# Patient Record
Sex: Female | Born: 1955 | Race: White | Hispanic: No | Smoking: Never smoker
Health system: Southern US, Community
[De-identification: ages and names within clinical notes are randomized; demographics above are authoritative.]

## PROBLEM LIST (undated history)

## (undated) DIAGNOSIS — E785 Hyperlipidemia, unspecified: Secondary | ICD-10-CM

## (undated) DIAGNOSIS — I10 Essential (primary) hypertension: Secondary | ICD-10-CM

## (undated) DIAGNOSIS — E119 Type 2 diabetes mellitus without complications: Secondary | ICD-10-CM

## (undated) HISTORY — PX: BREAST BIOPSY: SHX20

---

## 2017-04-30 ENCOUNTER — Other Ambulatory Visit: Payer: Self-pay

## 2017-04-30 ENCOUNTER — Emergency Department
Admission: EM | Admit: 2017-04-30 | Discharge: 2017-04-30 | Disposition: A | Discharge status: Home / Self Care | Payer: No Typology Code available for payment source | Attending: Emergency Medicine | Admitting: Emergency Medicine

## 2017-04-30 ENCOUNTER — Emergency Department: Payer: No Typology Code available for payment source

## 2017-04-30 DIAGNOSIS — M25561 Pain in right knee: Secondary | ICD-10-CM | POA: Insufficient documentation

## 2017-04-30 MED ORDER — MELOXICAM 15 MG PO TABS: 15.0000 mg | ORAL_TABLET | Freq: Every day | ORAL | 1 refills | Status: AC
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
## 2018-07-30 ENCOUNTER — Emergency Department: Payer: Medicare PPO

## 2018-07-30 ENCOUNTER — Other Ambulatory Visit: Payer: Self-pay

## 2018-07-30 ENCOUNTER — Emergency Department
Admission: EM | Admit: 2018-07-30 | Discharge: 2018-07-30 | Disposition: A | Discharge status: Home / Self Care | Payer: Medicare PPO | Attending: Emergency Medicine | Admitting: Emergency Medicine

## 2018-07-30 ENCOUNTER — Encounter: Payer: Self-pay | Admitting: Physician Assistant

## 2018-07-30 DIAGNOSIS — Y998 Other external cause status: Secondary | ICD-10-CM | POA: Insufficient documentation

## 2018-07-30 DIAGNOSIS — S0990XA Unspecified injury of head, initial encounter: Secondary | ICD-10-CM | POA: Diagnosis present

## 2018-07-30 DIAGNOSIS — Y929 Unspecified place or not applicable: Secondary | ICD-10-CM | POA: Insufficient documentation

## 2018-07-30 DIAGNOSIS — S0003XA Contusion of scalp, initial encounter: Secondary | ICD-10-CM | POA: Diagnosis not present

## 2018-07-30 DIAGNOSIS — Y93K1 Activity, walking an animal: Secondary | ICD-10-CM | POA: Diagnosis not present

## 2018-07-30 DIAGNOSIS — W010XXA Fall on same level from slipping, tripping and stumbling without subsequent striking against object, initial encounter: Secondary | ICD-10-CM | POA: Diagnosis not present

## 2018-07-30 LAB — BASIC METABOLIC PANEL
Anion gap: 9 (ref 5–15)
BUN: 12 mg/dL (ref 8–23)
CO2: 27 mmol/L (ref 22–32)
Calcium: 8.9 mg/dL (ref 8.9–10.3)
Chloride: 103 mmol/L (ref 98–111)
Creatinine, Ser: 0.75 mg/dL (ref 0.44–1.00)
GFR calc Af Amer: >60 mL/min (ref >60)
GFR calc non Af Amer: >60 mL/min (ref >60)
Glucose, Bld: 195 mg/dL — ABNORMAL HIGH (ref 70–99)
Potassium: 3.7 mmol/L (ref 3.5–5.1)
Sodium: 139 mmol/L (ref 135–145)

## 2018-07-30 LAB — CBC WITH DIFFERENTIAL/PLATELET
Abs Immature Granulocytes: 0.04 10*3/uL (ref 0.00–0.07)
Basophils Absolute: 0.1 10*3/uL (ref 0.0–0.1)
Basophils Relative: 1 %
Eosinophils Absolute: 0.2 10*3/uL (ref 0.0–0.5)
Eosinophils Relative: 2 %
HCT: 36.6 % (ref 36.0–46.0)
Hemoglobin: 12.2 g/dL (ref 12.0–15.0)
Immature Granulocytes: 1 %
Lymphocytes Relative: 28 %
Lymphs Abs: 2.1 10*3/uL (ref 0.7–4.0)
MCH: 29.5 pg (ref 26.0–34.0)
MCHC: 33.3 g/dL (ref 30.0–36.0)
MCV: 88.4 fL (ref 80.0–100.0)
Monocytes Absolute: 0.6 10*3/uL (ref 0.1–1.0)
Monocytes Relative: 8 %
Neutro Abs: 4.6 10*3/uL (ref 1.7–7.7)
Neutrophils Relative %: 60 %
Platelets: 263 10*3/uL (ref 150–400)
RBC: 4.14 MIL/uL (ref 3.87–5.11)
RDW: 12.8 % (ref 11.5–15.5)
WBC: 7.7 10*3/uL (ref 4.0–10.5)
nRBC: 0 % (ref 0.0–0.2)

## 2018-07-30 NOTE — Discharge Instructions (Addendum)
Your exam, labs, and CT scans are normal following your fall. Take OTC ibuprofen for pain as needed. Follow-up with your provider for ongoing symptoms.

## 2018-07-30 NOTE — ED Notes (Signed)
Patient AAOX4. Vitals Stable. NAD. 

## 2018-07-30 NOTE — ED Provider Notes (Addendum)
-----------------------------------------   2:39 PM on 07/30/2018 -----------------------------------------  Patient's work-up has been largely nonrevealing.  CT scan of the head and neck are negative for acute abnormality besides a large occipital hematoma.  I personally evaluated the patient.  I discussed the incidental finding of her lung nodule and the need to follow-up with her PCP regarding further imaging for evaluation.  Patient agreeable to plan of care.  Patient had no other concerns or questions from myself.  Patient will be discharged home.  EKG viewed and interpreted by myself shows a normal sinus rhythm at 70 bpm with a narrow QRS, left axis deviation, largely normal intervals with nonspecific ST changes without ST elevation.   Minna Antis, MD 07/30/18 1439    Minna Antis, MD 07/30/18 1453

## 2018-07-30 NOTE — ED Triage Notes (Signed)
Brooke Gardner arrived via [employer]. Was walking her dog when another dog "pounced on her" EMS stated that that was when she fell backwards and hit her head. Upon assessment there is a slight hematoma on the back of her head. No Loss of Consciousness.  Vitals stable.

## 2018-07-30 NOTE — ED Provider Notes (Signed)
[facility] Emergency Department Provider Note ____________________________________________  Time seen: 1230  I have reviewed the triage vital signs and the nursing notes.  HISTORY  Chief Complaint  Fall  HPI Brooke Gardner is a 63 y.o. female with a history of diabetes and hypertension, presents to the ED via EMS, for evaluation following a mechanical fall.  Patient describes she was out walking her dog, when another dog approached him, and counseled on her.  The large dog caused the patient to fall backwards, hitting the back of her head.  She denies any loss of consciousness, nausea, vomiting, or dizziness.  She presents for evaluation of a large hematoma to the back of the scalp.  She denies any significant pain related to her fall at this time.  She is denied any chest pain, shortness of breath, back pain, or other musculoskeletal injury.  History reviewed. No pertinent past medical history.  There are no active problems to display for this patient.  History reviewed. No pertinent surgical history.  Prior to Admission medications   Not on File    Allergies Percocet [oxycodone-acetaminophen]  History reviewed. No pertinent family history.  Social History Social History   Tobacco Use  . Smoking status: Not on file  Substance Use Topics  . Alcohol use: Not on file  . Drug use: Not on file    Review of Systems  Constitutional: Negative for fever. Eyes: Negative for visual changes. ENT: Negative for sore throat. Cardiovascular: Negative for chest pain. Respiratory: Negative for shortness of breath. Gastrointestinal: Negative for abdominal pain, vomiting and diarrhea. Genitourinary: Negative for dysuria. Musculoskeletal: Negative for back pain. Skin: Negative for rash. Neurological: Negative for headaches, focal weakness or numbness. ____________________________________________  PHYSICAL EXAM:  VITAL SIGNS: ED Triage Vitals  Enc Vitals  Group     BP      Pulse      Resp      Temp      Temp src      SpO2      Weight      Height      Head Circumference      Peak Flow      Pain Score      Pain Loc      Pain Edu?      Excl. in GC?     Constitutional: Alert and oriented. Well appearing and in no distress. Head: Normocephalic and atraumatic, except for a large hematoma to the crown. No laceration or abrasion noted. No Battle's sign. Eyes: Conjunctivae are normal. PERRL. Normal extraocular movements Ears: no otorrhea Nose: No congestion/rhinorrhea/epistaxis. Mouth/Throat: Mucous membranes are moist. Neck: Supple. No midline tenderness. Cardiovascular: Normal rate, regular rhythm. Normal distal pulses. Respiratory: Normal respiratory effort. No wheezes/rales/rhonchi. Gastrointestinal: Soft and nontender. No distention. Musculoskeletal: Nontender with normal range of motion in all extremities.  Neurologic:  CN II-XII grossly intact. Normal speech and language. No gross focal neurologic deficits are appreciated. Skin:  Skin is warm, dry and intact. No rash noted. Psychiatric: Mood and affect are normal. Patient exhibits appropriate insight and judgment. ____________________________________________   LABS (pertinent positives/negatives) Labs Reviewed  BASIC METABOLIC PANEL - Abnormal; Notable for the following components:      Result Value   Glucose, Bld 195 (*)    All other components within normal limits  CBC WITH DIFFERENTIAL/PLATELET  ____________________________________________  EKG  Sinus rhythm, variable rate 50-99 bpm Normal intervals No STEMI ____________________________________________   RADIOLOGY  CT Head & Cervical Spine w/o CM  IMPRESSION: 1. Large posterior left parietal scalp hematoma. 2. No evidence of acute intracranial abnormality. No evidence of calvarial fracture. 3. No cervical spine fracture or facet subluxation. 4. Marked multilevel degenerative changes in the cervical spine  as detailed. 5. Subsolid 2.0 cm posterior apical right upper lobe pulmonary nodule. Suggest chest CT on a short-term basis for further evaluation when clinically feasible. ____________________________________________  PROCEDURES  Procedures ____________________________________________  INITIAL IMPRESSION / ASSESSMENT AND PLAN / ED COURSE  Brooke Gardner was evaluated in Emergency Department on 07/30/2018 for the symptoms described in the history of present illness. She was evaluated in the context of the global COVID-19 pandemic, which necessitated consideration that the patient might be at risk for infection with the SARS-CoV-2 virus that causes COVID-19. Institutional protocols and algorithms that pertain to the evaluation of patients at risk for COVID-19 are in a state of rapid change based on information released by regulatory bodies including the CDC and federal and state organizations. These policies and algorithms were followed during the patient's care in the ED.  Patient with a reassuring exam following her mechanical fall prior to arrival.  She has a scalp hematoma without any intracranial process or acute cervical fracture on the CT imaging.  She be discharged with instructions to follow-up on incidental lung nodule finding on her C-spine as well.  Patient is stable for discharge at this time, and will follow-up with her primary provider.  Return precautions have been reviewed. ____________________________________________  FINAL CLINICAL IMPRESSION(S) / ED DIAGNOSES  Final diagnoses:  Contusion of scalp, initial encounter  Minor head injury, initial encounter      Lissa Hoard, PA-C 07/30/18 1418    Minna Antis, MD 07/30/18 1440

## 2019-05-01 ENCOUNTER — Other Ambulatory Visit: Payer: Self-pay | Admitting: Physician Assistant

## 2019-05-01 DIAGNOSIS — Z780 Asymptomatic menopausal state: Secondary | ICD-10-CM

## 2019-05-01 DIAGNOSIS — Z1231 Encounter for screening mammogram for malignant neoplasm of breast: Secondary | ICD-10-CM

## 2019-09-20 ENCOUNTER — Ambulatory Visit
Admission: RE | Admit: 2019-09-20 | Discharge: 2019-09-20 | Disposition: A | Discharge status: Home / Self Care | Payer: Medicare PPO | Source: Ambulatory Visit | Attending: Physician Assistant | Admitting: Physician Assistant

## 2019-09-20 ENCOUNTER — Other Ambulatory Visit: Payer: Self-pay

## 2019-09-20 DIAGNOSIS — Z1231 Encounter for screening mammogram for malignant neoplasm of breast: Secondary | ICD-10-CM | POA: Insufficient documentation

## 2019-09-20 DIAGNOSIS — Z780 Asymptomatic menopausal state: Secondary | ICD-10-CM | POA: Insufficient documentation

## 2019-10-09 ENCOUNTER — Inpatient Hospital Stay
Admission: RE | Admit: 2019-10-09 | Discharge: 2019-10-09 | Disposition: A | Discharge status: Home / Self Care | Payer: Self-pay | Source: Ambulatory Visit | Attending: *Deleted | Admitting: *Deleted

## 2019-10-09 ENCOUNTER — Other Ambulatory Visit: Payer: Self-pay | Admitting: Physician Assistant

## 2019-10-09 ENCOUNTER — Other Ambulatory Visit: Payer: Self-pay | Admitting: *Deleted

## 2019-10-09 DIAGNOSIS — Z1231 Encounter for screening mammogram for malignant neoplasm of breast: Secondary | ICD-10-CM

## 2019-10-09 DIAGNOSIS — R928 Other abnormal and inconclusive findings on diagnostic imaging of breast: Secondary | ICD-10-CM

## 2019-10-09 DIAGNOSIS — N6320 Unspecified lump in the left breast, unspecified quadrant: Secondary | ICD-10-CM

## 2019-10-11 ENCOUNTER — Ambulatory Visit: Payer: Medicare PPO

## 2019-10-11 ENCOUNTER — Other Ambulatory Visit: Payer: Medicare PPO

## 2022-03-26 IMAGING — MG DIGITAL SCREENING BILAT W/ TOMO W/ CAD
8 series · 8 of 24 positions shown · non-contrast
Comparison: None.
COMPARISON: None.

Addendum:
CLINICAL DATA: Screening.

EXAM:
DIGITAL SCREENING BILATERAL MAMMOGRAM WITH TOMO AND CAD

[L CC synth-2D]
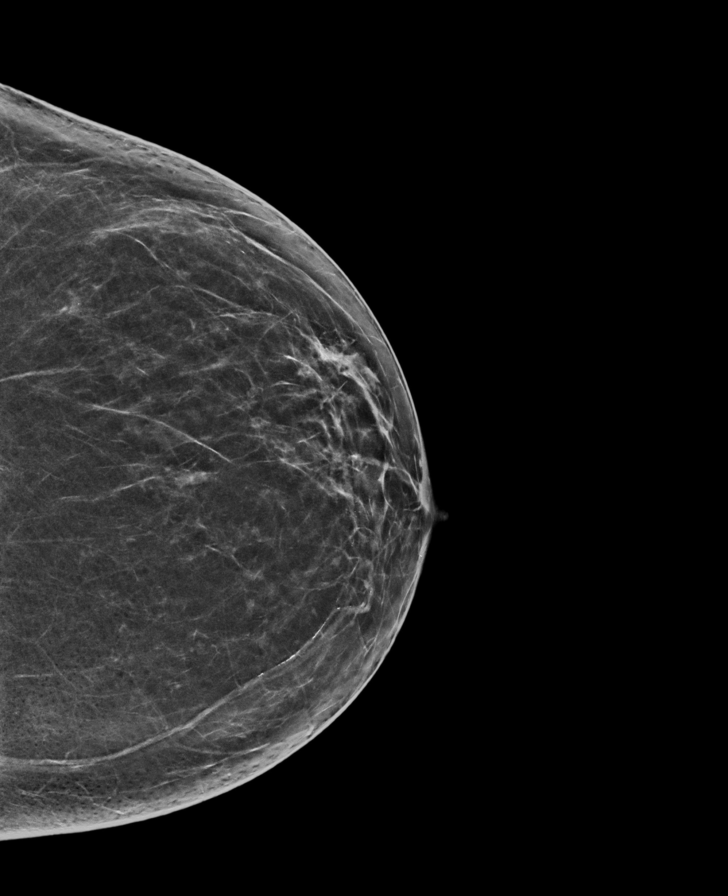

[R MLO synth-2D]
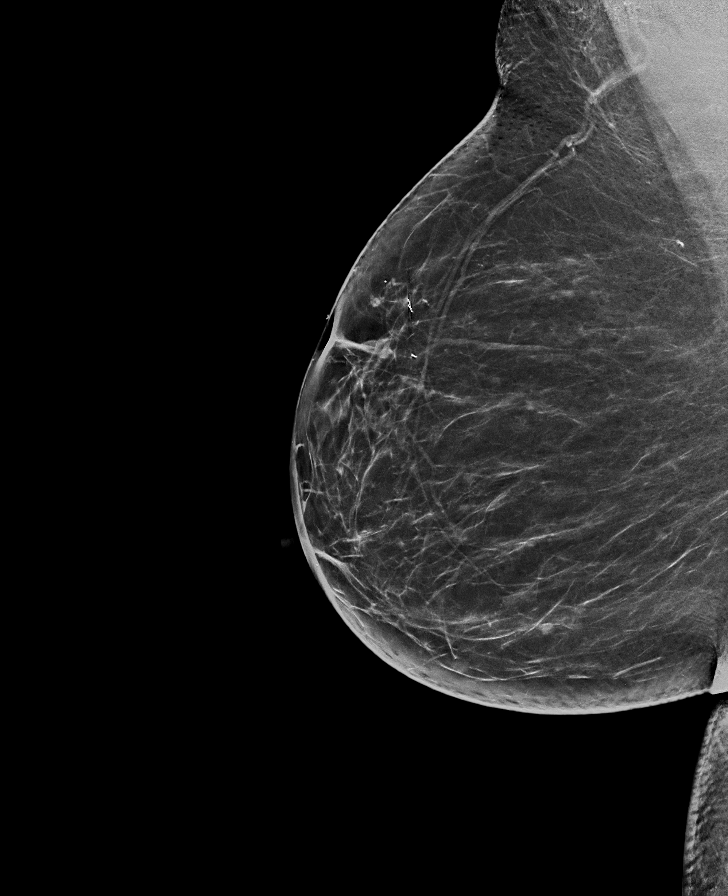

[R CC synth-2D]
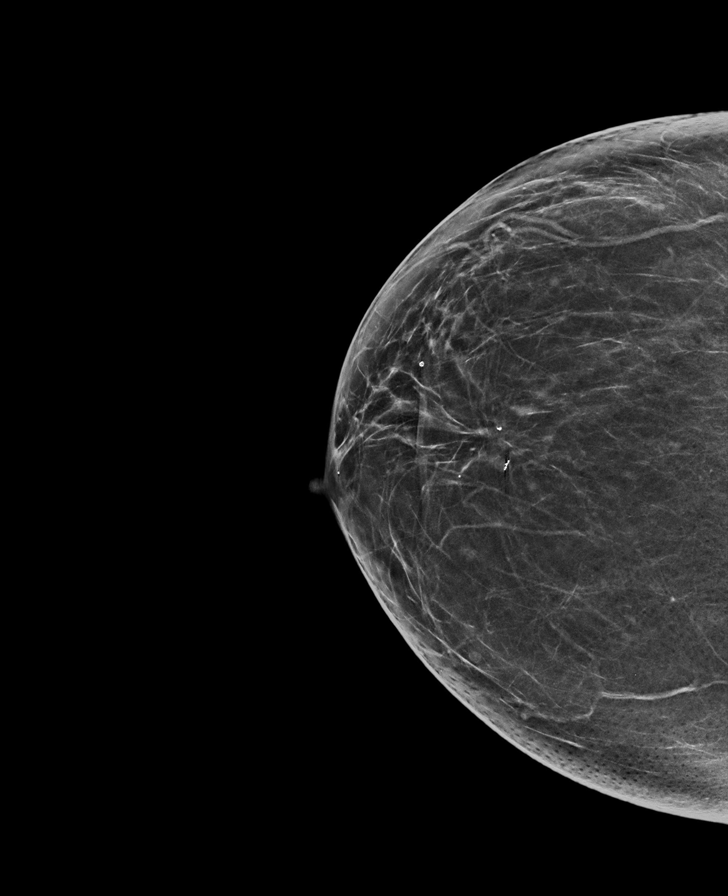

[L MLO synth-2D]
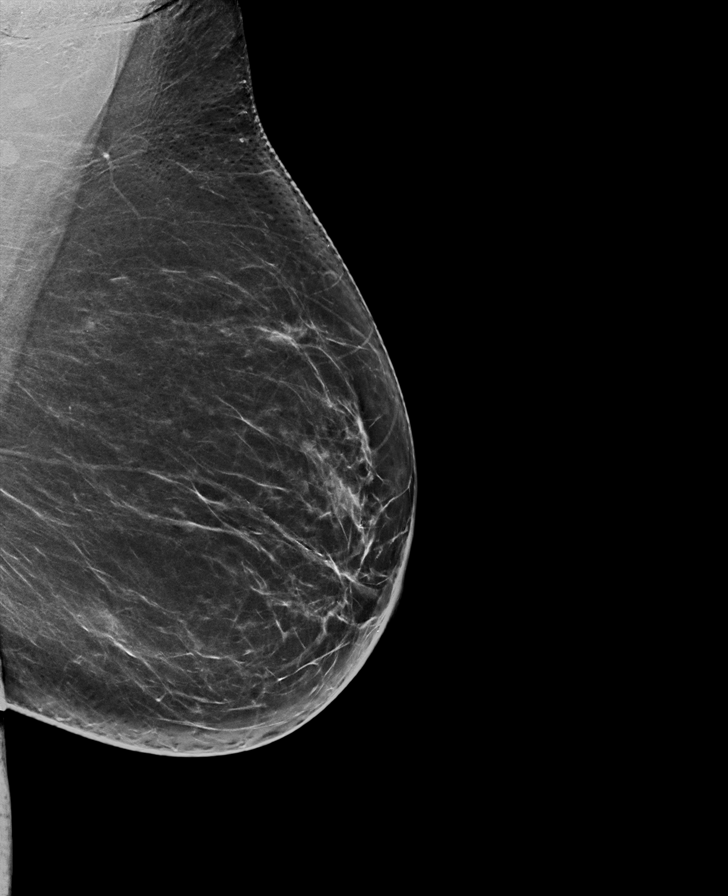

[R MLO tomo · tomo slice 37/74.0]
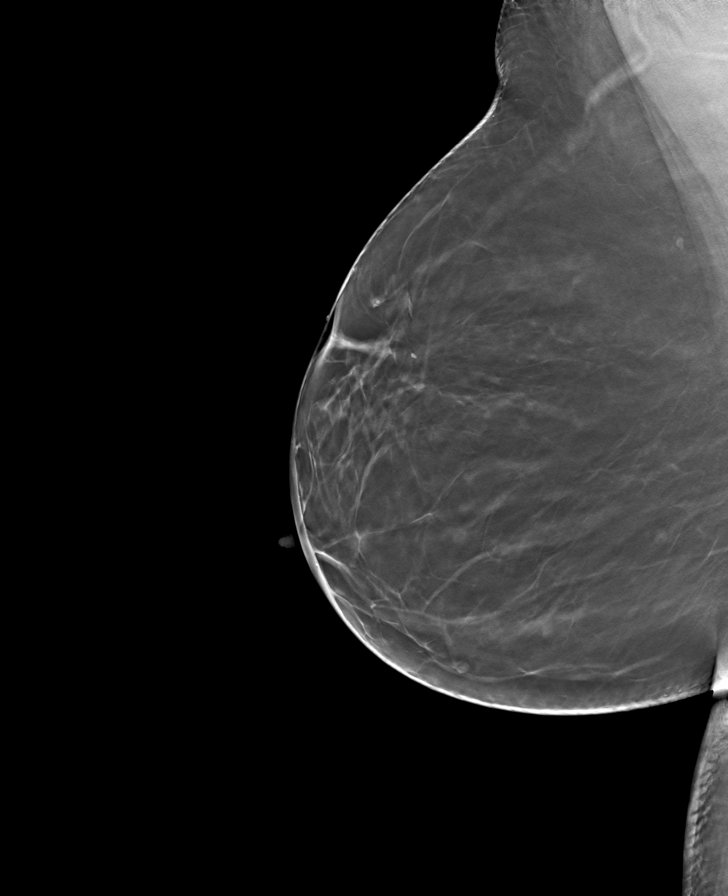

[L MLO tomo · tomo slice 41/81.0]
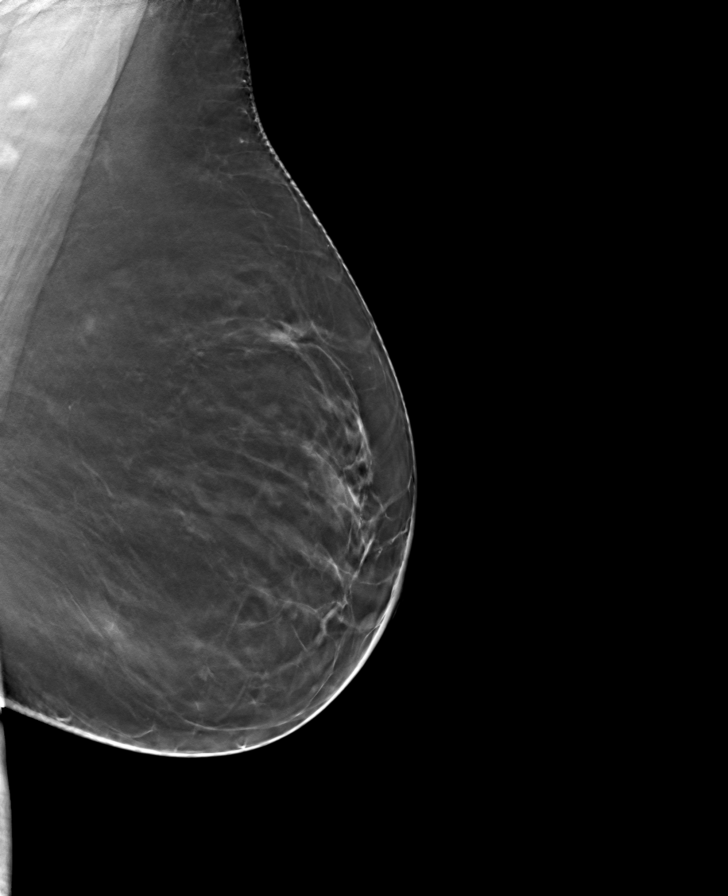

[R CC tomo · tomo slice 35/69.0]
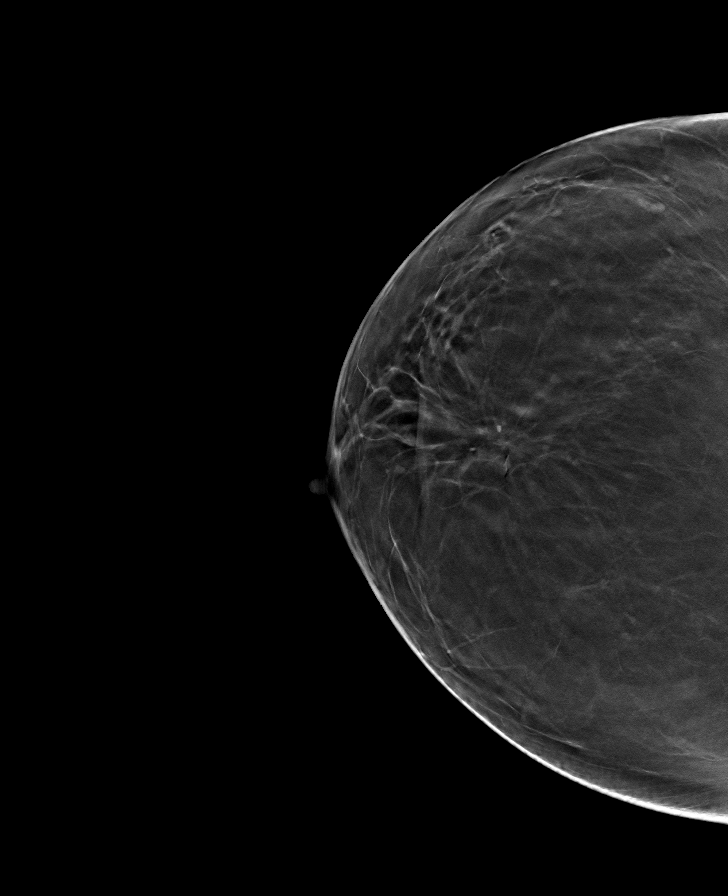

[L CC tomo · tomo slice 35/69.0]
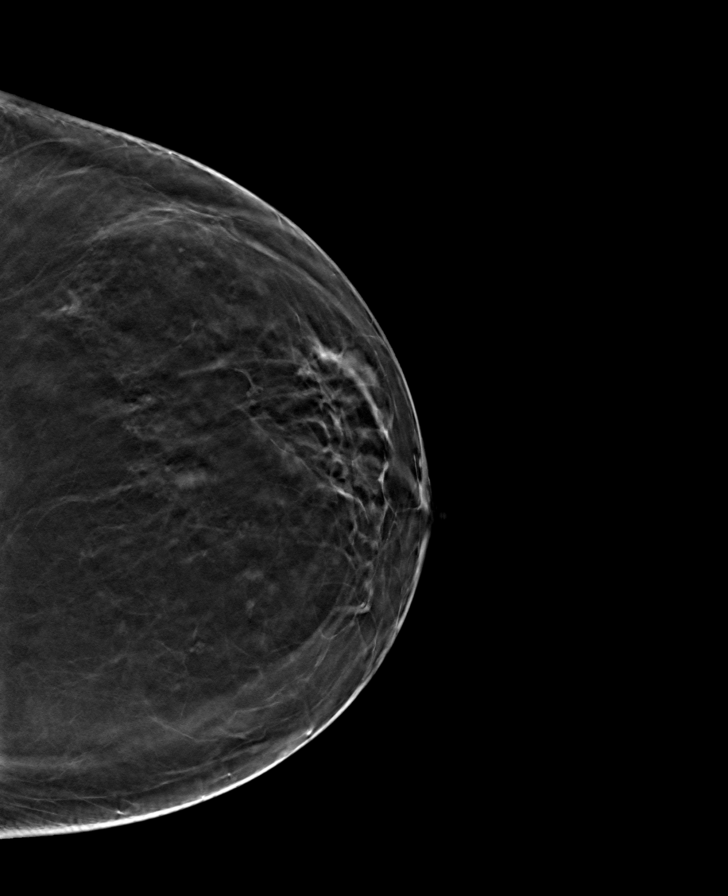

[8 of 24 positions shown; findings below may reference images not displayed]

ACR Breast Density Category b: There are scattered areas of
fibroglandular density.
FINDINGS: In the left breast, a possible mass warrants further evaluation. In
the right breast, no findings suspicious for malignancy.

Images were processed with CAD.
IMPRESSION: Further evaluation is suggested for possible mass in the left
breast.

RECOMMENDATION:
Diagnostic mammogram and possibly ultrasound of the left breast.
(Code:YS-B-FFR)

The patient will be contacted regarding the findings, and additional
imaging will be scheduled.

BI-RADS CATEGORY  0: Incomplete. Need additional imaging evaluation
and/or prior mammograms for comparison.

ADDENDUM:
Patient's prior imaging from 7355, 8332 and 9088 as become available
for review and comparison to the current screening exam. The
possible mass in the left breast is unchanged when compared to the
prior studies and therefore not a suspicious finding, likely a
normal intramammary lymph node.

Revised impression: No evidence of breast malignancy.

Revised recommendation: Screening mammogram in one
year.(Code:EB-F-7H7)

Revised BI-RADS category: 2: Benign.

*** End of Addendum ***
ACR Breast Density Category b: There are scattered areas of
fibroglandular density.
FINDINGS: In the left breast, a possible mass warrants further evaluation. In
the right breast, no findings suspicious for malignancy.

Images were processed with CAD.
IMPRESSION: Further evaluation is suggested for possible mass in the left
breast.

RECOMMENDATION:
Diagnostic mammogram and possibly ultrasound of the left breast.
(Code:YS-B-FFR)

The patient will be contacted regarding the findings, and additional
imaging will be scheduled.

BI-RADS CATEGORY  0: Incomplete. Need additional imaging evaluation
and/or prior mammograms for comparison.

## 2023-01-12 ENCOUNTER — Emergency Department: Payer: Medicare HMO

## 2023-01-12 ENCOUNTER — Other Ambulatory Visit: Payer: Self-pay

## 2023-01-12 ENCOUNTER — Inpatient Hospital Stay
Admission: EM | Admit: 2023-01-12 | Discharge: 2023-01-17 | DRG: 644 | Disposition: A | Discharge status: Skilled Nursing Facility | Payer: Medicare HMO | Attending: Internal Medicine | Admitting: Internal Medicine

## 2023-01-12 ENCOUNTER — Encounter: Payer: Self-pay | Admitting: Internal Medicine

## 2023-01-12 ENCOUNTER — Observation Stay: Payer: Medicare HMO

## 2023-01-12 DIAGNOSIS — K760 Fatty (change of) liver, not elsewhere classified: Secondary | ICD-10-CM | POA: Diagnosis present

## 2023-01-12 DIAGNOSIS — E871 Hypo-osmolality and hyponatremia: Secondary | ICD-10-CM | POA: Diagnosis not present

## 2023-01-12 DIAGNOSIS — R2681 Unsteadiness on feet: Secondary | ICD-10-CM

## 2023-01-12 DIAGNOSIS — Z6832 Body mass index (BMI) 32.0-32.9, adult: Secondary | ICD-10-CM

## 2023-01-12 DIAGNOSIS — Z820 Family history of epilepsy and other diseases of the nervous system: Secondary | ICD-10-CM

## 2023-01-12 DIAGNOSIS — I10 Essential (primary) hypertension: Secondary | ICD-10-CM | POA: Diagnosis not present

## 2023-01-12 DIAGNOSIS — R296 Repeated falls: Secondary | ICD-10-CM | POA: Diagnosis present

## 2023-01-12 DIAGNOSIS — E222 Syndrome of inappropriate secretion of antidiuretic hormone: Principal | ICD-10-CM | POA: Diagnosis present

## 2023-01-12 DIAGNOSIS — Y92009 Unspecified place in unspecified non-institutional (private) residence as the place of occurrence of the external cause: Secondary | ICD-10-CM

## 2023-01-12 DIAGNOSIS — W19XXXA Unspecified fall, initial encounter: Secondary | ICD-10-CM | POA: Diagnosis present

## 2023-01-12 DIAGNOSIS — R590 Localized enlarged lymph nodes: Secondary | ICD-10-CM | POA: Diagnosis present

## 2023-01-12 DIAGNOSIS — Z7984 Long term (current) use of oral hypoglycemic drugs: Secondary | ICD-10-CM

## 2023-01-12 DIAGNOSIS — K8020 Calculus of gallbladder without cholecystitis without obstruction: Secondary | ICD-10-CM | POA: Diagnosis present

## 2023-01-12 DIAGNOSIS — Z79899 Other long term (current) drug therapy: Secondary | ICD-10-CM

## 2023-01-12 DIAGNOSIS — E278 Other specified disorders of adrenal gland: Secondary | ICD-10-CM | POA: Diagnosis present

## 2023-01-12 DIAGNOSIS — Z833 Family history of diabetes mellitus: Secondary | ICD-10-CM

## 2023-01-12 DIAGNOSIS — E669 Obesity, unspecified: Secondary | ICD-10-CM | POA: Diagnosis present

## 2023-01-12 DIAGNOSIS — Z8249 Family history of ischemic heart disease and other diseases of the circulatory system: Secondary | ICD-10-CM

## 2023-01-12 DIAGNOSIS — E876 Hypokalemia: Secondary | ICD-10-CM | POA: Diagnosis present

## 2023-01-12 DIAGNOSIS — C3491 Malignant neoplasm of unspecified part of right bronchus or lung: Secondary | ICD-10-CM

## 2023-01-12 DIAGNOSIS — S0990XA Unspecified injury of head, initial encounter: Secondary | ICD-10-CM

## 2023-01-12 DIAGNOSIS — E785 Hyperlipidemia, unspecified: Secondary | ICD-10-CM | POA: Diagnosis not present

## 2023-01-12 DIAGNOSIS — E119 Type 2 diabetes mellitus without complications: Secondary | ICD-10-CM | POA: Diagnosis present

## 2023-01-12 DIAGNOSIS — E860 Dehydration: Secondary | ICD-10-CM | POA: Diagnosis present

## 2023-01-12 DIAGNOSIS — R918 Other nonspecific abnormal finding of lung field: Secondary | ICD-10-CM | POA: Diagnosis present

## 2023-01-12 HISTORY — DX: Type 2 diabetes mellitus without complications: E11.9

## 2023-01-12 HISTORY — DX: Hyperlipidemia, unspecified: E78.5

## 2023-01-12 HISTORY — DX: Essential (primary) hypertension: I10

## 2023-01-12 LAB — COMPREHENSIVE METABOLIC PANEL
ALT: 13 U/L (ref 0–44)
AST: 23 U/L (ref 15–41)
Albumin: 4.4 g/dL (ref 3.5–5.0)
Alkaline Phosphatase: 60 U/L (ref 38–126)
Anion gap: 12 (ref 5–15)
BUN: 11 mg/dL (ref 8–23)
CO2: 24 mmol/L (ref 22–32)
Calcium: 9.5 mg/dL (ref 8.9–10.3)
Chloride: 91 mmol/L — ABNORMAL LOW (ref 98–111)
Creatinine, Ser: 0.63 mg/dL (ref 0.44–1.00)
GFR, Estimated: >60 mL/min (ref >60)
Glucose, Bld: 99 mg/dL (ref 70–99)
Potassium: 3.9 mmol/L (ref 3.5–5.1)
Sodium: 127 mmol/L — ABNORMAL LOW (ref 135–145)
Total Bilirubin: 1.2 mg/dL (ref 0.3–1.2)
Total Protein: 7.7 g/dL (ref 6.5–8.1)

## 2023-01-12 LAB — BASIC METABOLIC PANEL
Anion gap: 12 (ref 5–15)
BUN: 10 mg/dL (ref 8–23)
CO2: 23 mmol/L (ref 22–32)
Calcium: 8.8 mg/dL — ABNORMAL LOW (ref 8.9–10.3)
Chloride: 92 mmol/L — ABNORMAL LOW (ref 98–111)
Creatinine, Ser: 0.59 mg/dL (ref 0.44–1.00)
GFR, Estimated: >60 mL/min (ref >60)
Glucose, Bld: 97 mg/dL (ref 70–99)
Potassium: 3.6 mmol/L (ref 3.5–5.1)
Sodium: 127 mmol/L — ABNORMAL LOW (ref 135–145)

## 2023-01-12 LAB — CBC
HCT: 36.1 % (ref 36.0–46.0)
Hemoglobin: 12.3 g/dL (ref 12.0–15.0)
MCH: 29.2 pg (ref 26.0–34.0)
MCHC: 34.1 g/dL (ref 30.0–36.0)
MCV: 85.7 fL (ref 80.0–100.0)
Platelets: 314 10*3/uL (ref 150–400)
RBC: 4.21 MIL/uL (ref 3.87–5.11)
RDW: 12.3 % (ref 11.5–15.5)
WBC: 8.8 10*3/uL (ref 4.0–10.5)
nRBC: 0 % (ref 0.0–0.2)

## 2023-01-12 LAB — MAGNESIUM: Magnesium: 1.3 mg/dL — ABNORMAL LOW (ref 1.7–2.4)

## 2023-01-12 LAB — TROPONIN I (HIGH SENSITIVITY): Troponin I (High Sensitivity): 5 ng/L (ref <18)

## 2023-01-12 LAB — OSMOLALITY: Osmolality: 268 mosm/kg — ABNORMAL LOW (ref 275–295)

## 2023-01-12 LAB — GLUCOSE, CAPILLARY: Glucose-Capillary: 92 mg/dL (ref 70–99)

## 2023-01-12 LAB — PHOSPHORUS: Phosphorus: 3.5 mg/dL (ref 2.5–4.6)

## 2023-01-12 MED ORDER — SODIUM CHLORIDE 1 G PO TABS
1.0000 g | ORAL_TABLET | Freq: Two times a day (BID) | ORAL | Status: DC
  Administered 2023-01-12 – 2023-01-17 (×10): 1 g via ORAL
  Filled 2023-01-12 (×10): qty 1

## 2023-01-12 MED ORDER — IBUPROFEN 400 MG PO TABS: 400.0000 mg | ORAL_TABLET | Freq: Four times a day (QID) | ORAL | Status: DC | PRN

## 2023-01-12 MED ORDER — HYDRALAZINE HCL 20 MG/ML IJ SOLN: 5.0000 mg | INTRAMUSCULAR | Status: DC | PRN

## 2023-01-12 MED ORDER — SODIUM CHLORIDE 0.9 % IV SOLN: INTRAVENOUS | Status: DC

## 2023-01-12 MED ORDER — ACETAMINOPHEN 325 MG PO TABS: 650.0000 mg | ORAL_TABLET | Freq: Four times a day (QID) | ORAL | Status: DC | PRN

## 2023-01-12 MED ORDER — ONDANSETRON HCL 4 MG/2ML IJ SOLN
4.0000 mg | Freq: Three times a day (TID) | INTRAMUSCULAR | Status: DC | PRN
  Administered 2023-01-14: 4 mg via INTRAVENOUS
  Filled 2023-01-12 (×2): qty 2

## 2023-01-12 MED ORDER — INSULIN ASPART 100 UNIT/ML IJ SOLN: 0.0000 [IU] | Freq: Every day | INTRAMUSCULAR | Status: DC

## 2023-01-12 MED ORDER — INSULIN ASPART 100 UNIT/ML IJ SOLN
0.0000 [IU] | Freq: Three times a day (TID) | INTRAMUSCULAR | Status: DC
  Administered 2023-01-13 – 2023-01-14 (×2): 1 [IU] via SUBCUTANEOUS
  Administered 2023-01-14: 2 [IU] via SUBCUTANEOUS
  Filled 2023-01-12 (×3): qty 1

## 2023-01-12 MED ORDER — IOHEXOL 350 MG/ML SOLN
75.0000 mL | Freq: Once | INTRAVENOUS | Status: AC | PRN
  Administered 2023-01-12: 75 mL via INTRAVENOUS

## 2023-01-12 MED ORDER — SODIUM CHLORIDE 0.9 % IV BOLUS
1000.0000 mL | Freq: Once | INTRAVENOUS | Status: AC
  Administered 2023-01-12: 1000 mL via INTRAVENOUS

## 2023-01-12 MED ORDER — MAGNESIUM SULFATE 2 GM/50ML IV SOLN
2.0000 g | Freq: Once | INTRAVENOUS | Status: AC
  Administered 2023-01-12: 2 g via INTRAVENOUS
  Filled 2023-01-12: qty 50

## 2023-01-12 NOTE — ED Provider Notes (Signed)
[facility] Provider Note    Event Date/Time   First MD Initiated Contact with Patient 01/12/23 1534     (approximate)   History   Fall   HPI  Brooke Gardner is a 67 y.o. female past medical history significant for diabetes, hypertension, who presents to the emergency department following an episode of dizziness.  Patient had 3 falls today secondary to feeling dizzy.  States that she first had a fall 4 weeks ago and was doing okay getting around until this past week when she has had significantly more falls.  She called her primary care physician today and was told to come into the emergency department for further evaluation.  States that over the past couple of weeks she has been having poor balance issues causing her fall.  Denies any room spinning dizziness while at rest.  Does endorse episodes of nausea and having a poor appetite over the past 2 days.  States that she sometimes wants to eat but then she cannot.  Denies any abdominal pain, vomiting, diarrhea.  Denies any fever or chills.  No dysuria, urinary urgency or frequency.  No prior history of stroke.  States that with one of her fall she did have head injury but she does not believe that she has hit her head since that time.  Not on anticoagulation.  Denies any chest pain or shortness of breath.     Physical Exam   Triage Vital Signs: ED Triage Vitals  Encounter Vitals Group     BP 01/12/23 1501 (!) 146/76     Systolic BP Percentile --      Diastolic BP Percentile --      Pulse Rate 01/12/23 1501 73     Resp 01/12/23 1501 18     Temp 01/12/23 1501 97.6 F (36.4 C)     Temp Source 01/12/23 1501 Oral     SpO2 01/12/23 1501 92 %     Weight 01/12/23 1503 170 lb (77.1 kg)     Height 01/12/23 1502 5\' 1"  (1.549 m)     Head Circumference --      Peak Flow --      Pain Score 01/12/23 1502 0     Pain Loc --      Pain Education --      Exclude from Growth Chart --     Most recent vital  signs: Vitals:   01/12/23 1700 01/12/23 1730  BP: (!) 147/76 (!) 164/78  Pulse: 64 62  Resp: 18 17  Temp:    SpO2: 97% 98%    Physical Exam Constitutional:      Appearance: She is well-developed.  HENT:     Head: Atraumatic.  Eyes:     Extraocular Movements: Extraocular movements intact.     Conjunctiva/sclera: Conjunctivae normal.     Pupils: Pupils are equal, round, and reactive to light.     Comments: No nystagmus  Cardiovascular:     Rate and Rhythm: Regular rhythm.  Pulmonary:     Effort: No respiratory distress.     Breath sounds: No wheezing.  Abdominal:     General: There is no distension.     Tenderness: There is no abdominal tenderness.  Musculoskeletal:        General: Normal range of motion.     Cervical back: Normal range of motion. No tenderness.  Skin:    General: Skin is warm.  Neurological:     Mental Status: She is alert.  Mental status is at baseline.     GCS: GCS eye subscore is 4. GCS verbal subscore is 5. GCS motor subscore is 6.     Cranial Nerves: Cranial nerves 2-12 are intact.     Sensory: Sensation is intact.     Motor: Motor function is intact.     Coordination: Coordination is intact.     Gait: Gait abnormal.     Comments: Gait imbalance, requiring significant assistance to ambulate     IMPRESSION / MDM / ASSESSMENT AND PLAN / ED COURSE  I reviewed the triage vital signs and the nursing notes.  Differential Daiya gnosis including dehydration, electrolyte abnormality, intracranial hemorrhage, dissection, CVA, peripheral vertigo, urinary tract infection  Last known well was 3 weeks ago  EKG  I, Corena Herter, the attending physician, personally viewed and interpreted this ECG.   Rate: Normal  Rhythm: Normal sinus  Axis: Normal  Intervals: Normal  ST&T Change: No significant ST elevation. Read as atrial flutter however significant artifact and has P waves, less concerned for atrial flutter.  No tachycardic or bradycardic  dysrhythmias while on cardiac telemetry.  RADIOLOGY I independently reviewed imaging, my interpretation of imaging: CT scan of the head without obvious intracranial hemorrhage. -I did not appreciate an obvious large dissection on CTA of the head and neck however CT scan is currently in process and read has not been finalized.  Chest x-ray without an obvious signs of pneumonia.  MRI of the brain obtained, no obvious CVA however read is currently pending.   LABS (all labs ordered are listed, but only abnormal results are displayed) Labs interpreted as -    Labs Reviewed  COMPREHENSIVE METABOLIC PANEL - Abnormal; Notable for the following components:      Result Value   Sodium 127 (*)    Chloride 91 (*)    All other components within normal limits  BASIC METABOLIC PANEL - Abnormal; Notable for the following components:   Sodium 127 (*)    Chloride 92 (*)    Calcium 8.8 (*)    All other components within normal limits  MAGNESIUM - Abnormal; Notable for the following components:   Magnesium 1.3 (*)    All other components within normal limits  CBC  PHOSPHORUS  URINALYSIS, ROUTINE W REFLEX MICROSCOPIC  OSMOLALITY, URINE  OSMOLALITY  SODIUM, URINE, RANDOM  BASIC METABOLIC PANEL  CBC  MAGNESIUM  TROPONIN I (HIGH SENSITIVITY)     MDM  Patient with significant hyponatremia with a sodium of 127, it appears to have a baseline of 139.  Likely secondary to hypovolemia and decreased p.o. intake.  Given 1 L of normal saline.  On reevaluation after IV fluids patient continued to have ongoing gait instability.  Given her poor p.o. intake, hyponatremia and gait instability consulted hospitalist for admission.       PROCEDURES:  Critical Care performed: yes  .Critical Care  Performed by: Corena Herter, MD Authorized by: Corena Herter, MD   Critical care provider statement:    Critical care time (minutes):  30   Critical care time was exclusive of:  Separately billable  procedures and treating other patients   Critical care was necessary to treat or prevent imminent or life-threatening deterioration of the following conditions:  Metabolic crisis   Critical care was time spent personally by me on the following activities:  Development of treatment plan with patient or surrogate, discussions with consultants, evaluation of patient's response to treatment, examination of patient, ordering and review of  laboratory studies, ordering and review of radiographic studies, ordering and performing treatments and interventions, pulse oximetry, re-evaluation of patient's condition and review of old charts   I assumed direction of critical care for this patient from another provider in my specialty: no     Care discussed with: admitting provider     Patient's presentation is most consistent with acute presentation with potential threat to life or bodily function.   MEDICATIONS ORDERED IN ED: Medications  sodium chloride tablet 1 g (1 g Oral Given 01/12/23 1959)  ondansetron (ZOFRAN) injection 4 mg (has no administration in time range)  hydrALAZINE (APRESOLINE) injection 5 mg (has no administration in time range)  insulin aspart (novoLOG) injection 0-9 Units (has no administration in time range)  insulin aspart (novoLOG) injection 0-5 Units (has no administration in time range)  0.9 %  sodium chloride infusion (has no administration in time range)  magnesium sulfate IVPB 2 g 50 mL (has no administration in time range)  ibuprofen (ADVIL) tablet 400 mg (has no administration in time range)  iohexol (OMNIPAQUE) 350 MG/ML injection 75 mL (75 mLs Intravenous Contrast Given 01/12/23 1806)  sodium chloride 0.9 % bolus 1,000 mL (0 mLs Intravenous Stopped 01/12/23 1959)    FINAL CLINICAL IMPRESSION(S) / ED DIAGNOSES   Final diagnoses:  Fall, initial encounter  Injury of head, initial encounter  Unstable gait  Hyponatremia     Rx / DC Orders   ED Discharge Orders      None        Note:  This document was prepared using Dragon voice recognition software and may include unintentional dictation errors.   Corena Herter, MD 01/12/23 2105

## 2023-01-12 NOTE — H&P (Signed)
History and Physical    Brooke Gardner ZOX:[id number] DOB: 1955/10/20 DOA: 01/12/2023  Referring MD/NP/PA:   PCP: [name], PA   Patient coming from:  The patient is coming from home.     Chief Complaint: fall  HPI: Brooke Gardner is a 67 y.o. female with medical history significant of HTN, HLD, DM, obesity, who presents with fall.  Patient states that she fell several times past month. The last fall was a week ago.  She states that she has poor balance, causing her to fall.  No loss of consciousness.  No acute injury. She hit her head one time, no neck injury. Denies dizziness.  No unilateral numbness or tingling in extremities.  No facial droop or slurred speech.  Patient denies any chest pain, cough, SOB.  She has nausea, poor appetite, denies vomiting, diarrhea or abdominal pain.  Denies symptoms of UTI.   Data reviewed independently and ED Course: pt was found to have WBC 8.8, sodium 127, Mg 1.3, phosphorus 3.5, GFR> 60, temperature normal, blood pressure 164/78, heart rate 73, RR 18, oxygen saturation 92-98% on room air.  Chest x-ray negative. Pt is place in tele bed for obs.   CTA of head and neck: ***   MRI-brain:    EKG: I have personally reviewed.  Sinus rhythm, QTc 416, LAD, low voltage, poor R wave progression   Review of Systems:   General: no fevers, chills, no body weight gain, has poor appetite, has fatigue HEENT: no blurry vision, hearing changes or sore throat Respiratory: no dyspnea, coughing, wheezing CV: no chest pain, no palpitations GI: has nausea, no vomiting, abdominal pain, diarrhea, constipation GU: no dysuria, burning on urination, increased urinary frequency, hematuria  Ext: no leg edema Neuro: no unilateral weakness, numbness, or tingling, no vision change or hearing loss. Has fall and poor balance Skin: no rash, no skin tear. MSK: No muscle spasm, no deformity, no limitation of range of movement in spin Heme: No easy bruising.  Travel  history: No recent long distant travel.   Allergy:  Allergies  Allergen Reactions   Percocet [Oxycodone-Acetaminophen] Itching    Past Medical History:  Diagnosis Date   Diabetes mellitus without complication (HCC)    HLD (hyperlipidemia)    HTN (hypertension)     Past Surgical History:  Procedure Laterality Date   BREAST BIOPSY Right    neg    Social History:  reports that she has never smoked. She has never used smokeless tobacco. She reports that she does not currently use alcohol. She reports that she does not use drugs.  Family History:  Family History  Problem Relation Age of Onset   Alzheimer's disease Mother    Hypertension Father    Diabetes Father    Alzheimer's disease Sister    Breast cancer Neg Hx      Prior to Admission medications   Not on File    Physical Exam: Vitals:   01/12/23 1503 01/12/23 1600 01/12/23 1700 01/12/23 1730  BP:  (!) 151/70 (!) 147/76 (!) 164/78  Pulse:  67 64 62  Resp:  17 18 17   Temp:      TempSrc:      SpO2:  99% 97% 98%  Weight: 77.1 kg     Height:       General: Not in acute distress.  Dry mucous membrane HEENT:       Eyes: PERRL, EOMI, no jaundice       ENT: No discharge from the  ears and nose, no pharynx injection, no tonsillar enlargement.        Neck: No JVD, no bruit, no mass felt. Heme: No neck lymph node enlargement. Cardiac: S1/S2, RRR, No murmurs, No gallops or rubs. Respiratory: No rales, wheezing, rhonchi or rubs. GI: Soft, nondistended, nontender, no rebound pain, no organomegaly, BS present. GU: No hematuria Ext: No pitting leg edema bilaterally. 1+DP/PT pulse bilaterally. Musculoskeletal: No joint deformities, No joint redness or warmth, no limitation of ROM in spin. Skin: No rashes.  Neuro: Alert, oriented X3, cranial nerves II-XII grossly intact, moves all extremities normally. Muscle strength 5/5 in all extremities, sensation to light touch intact.  Psych: Patient is not psychotic, no suicidal or  hemocidal ideation.  Labs on Admission: I have personally reviewed following labs and imaging studies  CBC: Recent Labs  Lab 01/12/23 1551  WBC 8.8  HGB 12.3  HCT 36.1  MCV 85.7  PLT 314   Basic Metabolic Panel: Recent Labs  Lab 01/12/23 1551 01/12/23 2000  NA 127* 127*  K 3.9 3.6  CL 91* 92*  CO2 24 23  GLUCOSE 99 97  BUN 11 10  CREATININE 0.63 0.59  CALCIUM 9.5 8.8*  MG 1.3*  --   PHOS 3.5  --    GFR: Estimated Creatinine Clearance: 62.3 mL/min (by C-G formula based on SCr of 0.59 mg/dL). Liver Function Tests: Recent Labs  Lab 01/12/23 1551  AST 23  ALT 13  ALKPHOS 60  BILITOT 1.2  PROT 7.7  ALBUMIN 4.4   No results for input(s): "LIPASE", "AMYLASE" in the last 168 hours. No results for input(s): "AMMONIA" in the last 168 hours. Coagulation Profile: No results for input(s): "INR", "PROTIME" in the last 168 hours. Cardiac Enzymes: No results for input(s): "CKTOTAL", "CKMB", "CKMBINDEX", "TROPONINI" in the last 168 hours. BNP (last 3 results) No results for input(s): "PROBNP" in the last 8760 hours. HbA1C: No results for input(s): "HGBA1C" in the last 72 hours. CBG: No results for input(s): "GLUCAP" in the last 168 hours. Lipid Profile: No results for input(s): "CHOL", "HDL", "LDLCALC", "TRIG", "CHOLHDL", "LDLDIRECT" in the last 72 hours. Thyroid Function Tests: No results for input(s): "TSH", "T4TOTAL", "FREET4", "T3FREE", "THYROIDAB" in the last 72 hours. Anemia Panel: No results for input(s): "VITAMINB12", "FOLATE", "FERRITIN", "TIBC", "IRON", "RETICCTPCT" in the last 72 hours. Urine analysis: No results found for: "COLORURINE", "APPEARANCEUR", "LABSPEC", "PHURINE", "GLUCOSEU", "HGBUR", "BILIRUBINUR", "KETONESUR", "PROTEINUR", "UROBILINOGEN", "NITRITE", "LEUKOCYTESUR" Sepsis Labs: @LABRCNTIP (procalcitonin:4,lacticidven:4) )No results found for this or any previous visit (from the past 240 hour(s)).   Radiological Exams on Admission: DG Chest 2  View  Result Date: 01/12/2023 CLINICAL DATA:  Shortness of breath. EXAM: CHEST - 2 VIEW COMPARISON:  None Available. FINDINGS: Bilateral lung fields are clear. Note is made of elevated right hemidiaphragm. Bilateral costophrenic angles are clear. Normal cardio-mediastinal silhouette. No acute osseous abnormalities. The soft tissues are within normal limits. IMPRESSION: No active cardiopulmonary disease. Electronically Signed   By: Jules Schick M.D.   On: 01/12/2023 17:13      Assessment/Plan Principal Problem:   Fall at home, initial encounter Active Problems:   HTN (hypertension)   HLD (hyperlipidemia)   Diabetes mellitus without complication (HCC)   Hyponatremia   Hypomagnesemia   Obesity (BMI 30-39.9)   Assessment and Plan:       Principal Problem:   Fall at home, initial encounter Active Problems:   HTN (hypertension)   HLD (hyperlipidemia)   Diabetes mellitus without complication (HCC)   Hyponatremia  Hypomagnesemia   Obesity (BMI 30-39.9)    DVT ppx: SCD ***   Code Status: Full code   Family Communication: Yes, patient's niece by phone  Disposition Plan:  Anticipate discharge back to previous environment  Consults called: None  Admission status and Level of care: Telemetry Medical:    for obs   Dispo: The patient is from: Home              Anticipated d/c is to: Home              Anticipated d/c date is: 1 day              Patient currently is not medically stable to d/c.    Severity of Illness:  The appropriate patient status for this patient is OBSERVATION. Observation status is judged to be reasonable and necessary in order to provide the required intensity of service to ensure the patient's safety. The patient's presenting symptoms, physical exam findings, and initial radiographic and laboratory data in the context of their medical condition is felt to place them at decreased risk for further clinical deterioration. Furthermore, it is  anticipated that the patient will be medically stable for discharge from the hospital within 2 midnights of admission.        Date of Service 01/12/2023    Lorretta Harp [facility]   If 7PM-7AM, please contact night-coverage [email or web address] 01/12/2023, 8:24 PM

## 2023-01-12 NOTE — ED Notes (Signed)
Per MD ambulate the pt. Pt able to stand and walked a few steps before stating "I think I'm going to throw up." Pt instructed to sit back down in the bed. Pt repositioned in bed. Gait unsteady upon ambulation.

## 2023-01-12 NOTE — ED Notes (Signed)
Patient in MRI

## 2023-01-12 NOTE — ED Triage Notes (Signed)
Pt has had multiple falls, head was hit three times, in the past couple weeks from difficulty of keeping balance. Denies dizziness. States she went to PCP and they wanted her to make sure she did not have a head bleed. Pt is not on blood thinners. States she's had decreased appetite.

## 2023-01-13 ENCOUNTER — Inpatient Hospital Stay: Payer: Medicare HMO

## 2023-01-13 DIAGNOSIS — W19XXXA Unspecified fall, initial encounter: Secondary | ICD-10-CM | POA: Diagnosis present

## 2023-01-13 DIAGNOSIS — Z79899 Other long term (current) drug therapy: Secondary | ICD-10-CM | POA: Diagnosis not present

## 2023-01-13 DIAGNOSIS — K8020 Calculus of gallbladder without cholecystitis without obstruction: Secondary | ICD-10-CM | POA: Diagnosis present

## 2023-01-13 DIAGNOSIS — E278 Other specified disorders of adrenal gland: Secondary | ICD-10-CM | POA: Diagnosis present

## 2023-01-13 DIAGNOSIS — E871 Hypo-osmolality and hyponatremia: Secondary | ICD-10-CM | POA: Diagnosis present

## 2023-01-13 DIAGNOSIS — E222 Syndrome of inappropriate secretion of antidiuretic hormone: Secondary | ICD-10-CM | POA: Diagnosis present

## 2023-01-13 DIAGNOSIS — Z820 Family history of epilepsy and other diseases of the nervous system: Secondary | ICD-10-CM | POA: Diagnosis not present

## 2023-01-13 DIAGNOSIS — E119 Type 2 diabetes mellitus without complications: Secondary | ICD-10-CM | POA: Diagnosis present

## 2023-01-13 DIAGNOSIS — C3491 Malignant neoplasm of unspecified part of right bronchus or lung: Secondary | ICD-10-CM | POA: Diagnosis not present

## 2023-01-13 DIAGNOSIS — E876 Hypokalemia: Secondary | ICD-10-CM | POA: Diagnosis present

## 2023-01-13 DIAGNOSIS — I10 Essential (primary) hypertension: Secondary | ICD-10-CM | POA: Diagnosis present

## 2023-01-13 DIAGNOSIS — Z7984 Long term (current) use of oral hypoglycemic drugs: Secondary | ICD-10-CM | POA: Diagnosis not present

## 2023-01-13 DIAGNOSIS — R590 Localized enlarged lymph nodes: Secondary | ICD-10-CM | POA: Diagnosis present

## 2023-01-13 DIAGNOSIS — E669 Obesity, unspecified: Secondary | ICD-10-CM | POA: Diagnosis present

## 2023-01-13 DIAGNOSIS — Z833 Family history of diabetes mellitus: Secondary | ICD-10-CM | POA: Diagnosis not present

## 2023-01-13 DIAGNOSIS — K760 Fatty (change of) liver, not elsewhere classified: Secondary | ICD-10-CM | POA: Diagnosis present

## 2023-01-13 DIAGNOSIS — E860 Dehydration: Secondary | ICD-10-CM | POA: Diagnosis present

## 2023-01-13 DIAGNOSIS — Z8249 Family history of ischemic heart disease and other diseases of the circulatory system: Secondary | ICD-10-CM | POA: Diagnosis not present

## 2023-01-13 DIAGNOSIS — Y92009 Unspecified place in unspecified non-institutional (private) residence as the place of occurrence of the external cause: Secondary | ICD-10-CM | POA: Diagnosis not present

## 2023-01-13 DIAGNOSIS — R296 Repeated falls: Secondary | ICD-10-CM | POA: Diagnosis present

## 2023-01-13 DIAGNOSIS — Z6832 Body mass index (BMI) 32.0-32.9, adult: Secondary | ICD-10-CM | POA: Diagnosis not present

## 2023-01-13 DIAGNOSIS — R918 Other nonspecific abnormal finding of lung field: Secondary | ICD-10-CM

## 2023-01-13 DIAGNOSIS — E785 Hyperlipidemia, unspecified: Secondary | ICD-10-CM | POA: Diagnosis present

## 2023-01-13 LAB — BASIC METABOLIC PANEL
Anion gap: 9 (ref 5–15)
Anion gap: 9 (ref 5–15)
Anion gap: 10 (ref 5–15)
BUN: 11 mg/dL (ref 8–23)
BUN: 10 mg/dL (ref 8–23)
BUN: 10 mg/dL (ref 8–23)
CO2: 27 mmol/L (ref 22–32)
CO2: 25 mmol/L (ref 22–32)
CO2: 24 mmol/L (ref 22–32)
Calcium: 8.7 mg/dL — ABNORMAL LOW (ref 8.9–10.3)
Calcium: 8.6 mg/dL — ABNORMAL LOW (ref 8.9–10.3)
Calcium: 8.7 mg/dL — ABNORMAL LOW (ref 8.9–10.3)
Chloride: 93 mmol/L — ABNORMAL LOW (ref 98–111)
Chloride: 93 mmol/L — ABNORMAL LOW (ref 98–111)
Chloride: 93 mmol/L — ABNORMAL LOW (ref 98–111)
Creatinine, Ser: 0.71 mg/dL (ref 0.44–1.00)
Creatinine, Ser: 0.72 mg/dL (ref 0.44–1.00)
Creatinine, Ser: 0.71 mg/dL (ref 0.44–1.00)
GFR, Estimated: >60 mL/min (ref >60)
GFR, Estimated: >60 mL/min (ref >60)
GFR, Estimated: >60 mL/min (ref >60)
Glucose, Bld: 90 mg/dL (ref 70–99)
Glucose, Bld: 133 mg/dL — ABNORMAL HIGH (ref 70–99)
Glucose, Bld: 128 mg/dL — ABNORMAL HIGH (ref 70–99)
Potassium: 3 mmol/L — ABNORMAL LOW (ref 3.5–5.1)
Potassium: 3.2 mmol/L — ABNORMAL LOW (ref 3.5–5.1)
Potassium: 3.9 mmol/L (ref 3.5–5.1)
Sodium: 129 mmol/L — ABNORMAL LOW (ref 135–145)
Sodium: 127 mmol/L — ABNORMAL LOW (ref 135–145)
Sodium: 127 mmol/L — ABNORMAL LOW (ref 135–145)

## 2023-01-13 LAB — CBC
HCT: 30.3 % — ABNORMAL LOW (ref 36.0–46.0)
Hemoglobin: 10.8 g/dL — ABNORMAL LOW (ref 12.0–15.0)
MCH: 29.8 pg (ref 26.0–34.0)
MCHC: 35.6 g/dL (ref 30.0–36.0)
MCV: 83.5 fL (ref 80.0–100.0)
Platelets: 265 10*3/uL (ref 150–400)
RBC: 3.63 MIL/uL — ABNORMAL LOW (ref 3.87–5.11)
RDW: 12.3 % (ref 11.5–15.5)
WBC: 9.6 10*3/uL (ref 4.0–10.5)
nRBC: 0 % (ref 0.0–0.2)

## 2023-01-13 LAB — URINALYSIS, ROUTINE W REFLEX MICROSCOPIC
Bilirubin Urine: NEGATIVE
Glucose, UA: NEGATIVE mg/dL
Hgb urine dipstick: NEGATIVE
Ketones, ur: 20 mg/dL — AB
Leukocytes,Ua: NEGATIVE
Nitrite: NEGATIVE
Protein, ur: NEGATIVE mg/dL
Specific Gravity, Urine: 1.025 (ref 1.005–1.030)
pH: 6 (ref 5.0–8.0)

## 2023-01-13 LAB — GLUCOSE, CAPILLARY
Glucose-Capillary: 92 mg/dL (ref 70–99)
Glucose-Capillary: 124 mg/dL — ABNORMAL HIGH (ref 70–99)
Glucose-Capillary: 116 mg/dL — ABNORMAL HIGH (ref 70–99)

## 2023-01-13 LAB — OSMOLALITY, URINE: Osmolality, Ur: 564 mosm/kg (ref 300–900)

## 2023-01-13 LAB — SODIUM, URINE, RANDOM: Sodium, Ur: 93 mmol/L

## 2023-01-13 LAB — MAGNESIUM: Magnesium: 1.5 mg/dL — ABNORMAL LOW (ref 1.7–2.4)

## 2023-01-13 MED ORDER — IOHEXOL 300 MG/ML  SOLN
75.0000 mL | Freq: Once | INTRAMUSCULAR | Status: AC | PRN
  Administered 2023-01-13: 75 mL via INTRAVENOUS

## 2023-01-13 MED ORDER — POTASSIUM CHLORIDE 20 MEQ PO PACK
40.0000 meq | PACK | Freq: Two times a day (BID) | ORAL | Status: DC
  Administered 2023-01-13 – 2023-01-17 (×9): 40 meq via ORAL
  Filled 2023-01-13 (×9): qty 2

## 2023-01-13 MED ORDER — ATORVASTATIN CALCIUM 20 MG PO TABS
20.0000 mg | ORAL_TABLET | Freq: Every day | ORAL | Status: DC
  Administered 2023-01-13 – 2023-01-14 (×2): 20 mg via ORAL
  Filled 2023-01-13 (×2): qty 1

## 2023-01-13 MED ORDER — HEPARIN SODIUM (PORCINE) 5000 UNIT/ML IJ SOLN
5000.0000 [IU] | Freq: Three times a day (TID) | INTRAMUSCULAR | Status: DC
  Administered 2023-01-13 – 2023-01-17 (×13): 5000 [IU] via SUBCUTANEOUS
  Filled 2023-01-13 (×13): qty 1

## 2023-01-13 NOTE — TOC Progression Note (Signed)
Transition of Care [facility]) - Progression Note    Patient Details  Name: Brooke Gardner MRN: [id number] Date of Birth: 08-07-1955  Transition of Care [facility]) CM/SW Contact  Garret Reddish, RN Phone Number: 01/13/2023, 1:38 PM  Clinical Narrative:    Chart reviewed.  I have spoken with Mrs. Sheria Lang. Mrs. Prevost informs me that prior to admission she lived by herself.  She reports that she was independent of all ADL's.  Mrs. Thorns reports that she used a walker or cane if she went out to store.  She did not use any assistive device while ambulating in her home.  Mrs. Withers was driving prior to admission.    I have spoken to Mrs. Sheria Lang about PT/OT recommendation for Short term rehab.  Mrs. Hutmacher is agreeable. Mrs. Uncapher reports that he sister is in [place] and that is where she would like to go.  She is agreeable to bed search in the [place] area.    FL 2 completed, and Pasur obtained.  Bed search completed to [place] area facilities.    TOC will continue to follow for discharge planning.    TOC will continue to follow for discharge planning.     Expected Discharge Plan: Skilled Nursing Facility Barriers to Discharge: No Barriers Identified  Expected Discharge Plan and Services     Post Acute Care Choice: Skilled Nursing Facility Living arrangements for the past 2 months: Single Family Home                                       Social Determinants of Health (SDOH) Interventions SDOH Screenings   Food Insecurity: No Food Insecurity (01/12/2023)  Housing: Low Risk  (01/12/2023)  Transportation Needs: No Transportation Needs (01/12/2023)  Utilities: Not At Risk (01/12/2023)  Financial Resource Strain: Medium Risk (09/16/2022)   Received from [facility]  Tobacco Use: Low Risk  (01/12/2023)    Readmission Risk Interventions     No data to display

## 2023-01-13 NOTE — Evaluation (Signed)
Physical Therapy Evaluation Patient Details Name: Brooke Gardner MRN: [id number] DOB: 01/17/1956 Today's Date: 01/13/2023  History of Present Illness  67 y/o female presented to ED on 01/12/23 after multiple falls and hitting head x 3 in past couple. MRI brain negative. PMH: HTN, DM, HLD  Clinical Impression  Patient admitted with the above. PTA, patient lives alone and reports she was modI with use of cane and most recently a RW. History of 3 falls in last month from tripping over objects. Patient presents with weakness, impaired balance, decreased safety awareness, and decreased activity tolerance. Patient required minA to stand from EOB with RW. Ambulated 5' x2 with RW and CGA with patient experiencing feeling of emesis but quickly resolved in sitting. Orthostatics obtained, see below. Patient will benefit from skilled PT services during acute stay to address listed deficits. Patient will benefit from ongoing therapy at discharge to maximize functional independence and safety.   Orthostatic BPs  Supine 168/75  Sitting 140/71  Standing 140/64  Standing after 2 min 124/64  Sitting after 5' ambulation 141/65          If plan is discharge home, recommend the following: A lot of help with walking and/or transfers;A lot of help with bathing/dressing/bathroom;Assistance with cooking/housework;Assist for transportation;Help with stairs or ramp for entrance   Can travel by private vehicle   Yes    Equipment Recommendations Rolling Sabiha Sura (2 wheels);BSC/3in1  Recommendations for Other Services       Functional Status Assessment Patient has had a recent decline in their functional status and demonstrates the ability to make significant improvements in function in a reasonable and predictable amount of time.     Precautions / Restrictions Precautions Precautions: Fall Restrictions Weight Bearing Restrictions: No      Mobility  Bed Mobility Overal bed mobility: Needs Assistance Bed  Mobility: Supine to Sit, Sit to Supine     Supine to sit: Supervision, Used rails Sit to supine: Supervision, Used rails        Transfers Overall transfer level: Needs assistance Equipment used: Rolling Alexa Blish (2 wheels) Transfers: Sit to/from Stand Sit to Stand: Min assist                Ambulation/Gait Ambulation/Gait assistance: Contact guard assist Gait Distance (Feet): 5 Feet (+5') Assistive device: Rolling Vitoria Conyer (2 wheels) Gait Pattern/deviations: Step-to pattern, Decreased stride length, Trunk flexed Gait velocity: decreased     General Gait Details: after 5' ambulation, patient experiencing feeling of emesis. Provided patient BSC to sit on with resolution of symptoms. Returned back to bed after with no complaints  Stairs            Wheelchair Mobility     Tilt Bed    Modified Rankin (Stroke Patients Only)       Balance Overall balance assessment: Needs assistance Sitting-balance support: Bilateral upper extremity supported, Feet supported Sitting balance-Leahy Scale: Good     Standing balance support: During functional activity, Reliant on assistive device for balance, Bilateral upper extremity supported Standing balance-Leahy Scale: Fair                               Pertinent Vitals/Pain Pain Assessment Pain Assessment: No/denies pain    Home Living Family/patient expects to be discharged to:: Private residence Living Arrangements: Alone   Type of Home: Mobile home Home Access: Stairs to enter Entrance Stairs-Rails: Right;Left;Can reach both Entrance Stairs-Number of Steps: 5   Home Layout:  One level Home Equipment: Cane - single Librarian, academic (2 wheels)      Prior Function Prior Level of Function : Independent/Modified Independent;Driving             Mobility Comments: uses SPC for mobility. Has recently been using deceased husband's RW that is too tall for patient. Reports 3 falls in past month from  tripping over objects. Pt was driving up until this past Sunday.       Extremity/Trunk Assessment   Upper Extremity Assessment Upper Extremity Assessment: Defer to OT evaluation    Lower Extremity Assessment Lower Extremity Assessment: Generalized weakness    Cervical / Trunk Assessment Cervical / Trunk Assessment: Kyphotic  Communication   Communication Communication: No apparent difficulties Cueing Techniques: Verbal cues;Gestural cues  Cognition Arousal: Alert Behavior During Therapy: WFL for tasks assessed/performed Overall Cognitive Status: Within Functional Limits for tasks assessed                                          General Comments      Exercises     Assessment/Plan    PT Assessment Patient needs continued PT services  PT Problem List Decreased strength;Decreased activity tolerance;Decreased balance;Decreased mobility;Decreased safety awareness;Decreased knowledge of precautions;Cardiopulmonary status limiting activity       PT Treatment Interventions DME instruction;Gait training;Functional mobility training;Therapeutic activities;Therapeutic exercise;Stair training;Balance training;Patient/family education    PT Goals (Current goals can be found in the Care Plan section)  Acute Rehab PT Goals Patient Stated Goal: to go home PT Goal Formulation: With patient Time For Goal Achievement: 01/27/23 Potential to Achieve Goals: Good    Frequency Min 1X/week     Co-evaluation               AM-PAC PT "6 Clicks" Mobility  Outcome Measure Help needed turning from your back to your side while in a flat bed without using bedrails?: A Little Help needed moving from lying on your back to sitting on the side of a flat bed without using bedrails?: A Little Help needed moving to and from a bed to a chair (including a wheelchair)?: A Little Help needed standing up from a chair using your arms (e.g., wheelchair or bedside chair)?: A  Little Help needed to walk in hospital room?: A Lot Help needed climbing 3-5 steps with a railing? : A Lot 6 Click Score: 16    End of Session   Activity Tolerance: Patient limited by fatigue Patient left: in bed;with call bell/phone within reach;with bed alarm set Nurse Communication: Mobility status PT Visit Diagnosis: Unsteadiness on feet (R26.81);Muscle weakness (generalized) (M62.81);History of falling (Z91.81)    Time: 4098-1191 PT Time Calculation (min) (ACUTE ONLY): 29 min   Charges:   PT Evaluation $PT Eval Moderate Complexity: 1 Mod PT Treatments $Therapeutic Activity: 8-22 mins PT General Charges $$ ACUTE PT VISIT: 1 Visit         Maylon Peppers, PT, DPT Physical Therapist - [facility]  [facility]   Thatiana Renbarger A Farhana Fellows 01/13/2023, 11:32 AM

## 2023-01-13 NOTE — Evaluation (Signed)
Occupational Therapy Evaluation Patient Details Name: Brooke Gardner MRN: [id number] DOB: 12/05/1955 Today's Date: 01/13/2023   History of Present Illness Kaneisha Carlston is a 67 y.o. female with medical history significant of HTN, HLD, DM, obesity, who presents with fall.   Clinical Impression   Pt was seen for OT evaluation this date. Prior to hospital admission, pt was independent with all ADL's and IADL's. Pt lives alone and does not have help nearby. Pt presents to acute OT demonstrating impaired ADL performance and functional mobility 2/2 (See OT problem list for additional functional deficits). Upon arrival to room, pt supine in bed with HOB elevated. Pt reported feeling nervous and anxious. Shaking and heavy breathing noted during the session. Pt completed supine<>sit t/f with supervision/safety to the EOB. Pt completed sit<>stand t/f with Min A +RW and completed a stand pivot to the [facility] with Min G. Pt completed toilet t/f with Min G and completed toilet management and hygiene while seated and lower body dressing while standing with supervision/safety. Pt returned to bed and left in supine with HOB elevated, call bell within reach, and bed alarm. Pt would benefit from skilled OT services to address noted impairments and functional limitations (see below for any additional details) in order to maximize safety and independence while minimizing falls risk. Anticipate the need for follow up OT services upon acute hospital DC.        If plan is discharge home, recommend the following: A little help with walking and/or transfers;A little help with bathing/dressing/bathroom;Assistance with cooking/housework;Assist for transportation;Help with stairs or ramp for entrance    Functional Status Assessment  Patient has had a recent decline in their functional status and demonstrates the ability to make significant improvements in function in a reasonable and predictable amount of time.  Equipment  Recommendations  Other (comment) (Defer to next venue of care)    Recommendations for Other Services       Precautions / Restrictions Precautions Precautions: Fall Restrictions Weight Bearing Restrictions: No      Mobility Bed Mobility Overal bed mobility: Needs Assistance Bed Mobility: Supine to Sit, Sit to Supine     Supine to sit: Supervision, Used rails Sit to supine: Supervision, Used rails        Transfers Overall transfer level: Needs assistance Equipment used: Rolling walker (2 wheels) Transfers: Sit to/from Stand Sit to Stand: Min assist                  Balance Overall balance assessment: Needs assistance Sitting-balance support: Bilateral upper extremity supported, Feet supported Sitting balance-Leahy Scale: Good     Standing balance support: During functional activity, Reliant on assistive device for balance, Bilateral upper extremity supported Standing balance-Leahy Scale: Fair                             ADL either performed or assessed with clinical judgement   ADL Overall ADL's : Needs assistance/impaired     Grooming: Wash/dry hands;Supervision/safety;Set up;Sitting               Lower Body Dressing: Supervision/safety;Sit to/from stand   Toilet Transfer: Contact guard assist;BSC/3in1;Rolling walker (2 wheels)   Toileting- Clothing Manipulation and Hygiene: Supervision/safety;Set up       Functional mobility during ADLs: Contact guard assist;Set up;Supervision/safety;Rolling walker (2 wheels)       Vision Patient Visual Report: No change from baseline       Perception  Praxis         Pertinent Vitals/Pain Pain Assessment Pain Assessment: No/denies pain     Extremity/Trunk Assessment Upper Extremity Assessment Upper Extremity Assessment: Overall WFL for tasks assessed   Lower Extremity Assessment Lower Extremity Assessment: Defer to PT evaluation       Communication  Communication Communication: No apparent difficulties Cueing Techniques: Verbal cues;Gestural cues   Cognition Arousal: Alert Behavior During Therapy: WFL for tasks assessed/performed Overall Cognitive Status: Within Functional Limits for tasks assessed                                       General Comments       Exercises     Shoulder Instructions      Home Living Family/patient expects to be discharged to:: Private residence Living Arrangements: Alone   Type of Home: Mobile home Home Access: Stairs to enter Entrance Stairs-Number of Steps: 5 Entrance Stairs-Rails: Right;Left;Can reach both Home Layout: One level     Bathroom Shower/Tub: Sponge bathes at baseline;Tub/shower unit   Bathroom Toilet: Standard     Home Equipment: Cane - single Librarian, academic (2 wheels)          Prior Functioning/Environment Prior Level of Function : Independent/Modified Independent;Driving             Mobility Comments: uses SPC for mobility. Has recently been using deceased husband's RW that is too tall for patient. Reports 3 falls in past month from tripping over objects. Pt was driving up until this past Sunday.          OT Problem List: Decreased strength;Decreased coordination;Decreased range of motion;Decreased activity tolerance;Impaired balance (sitting and/or standing);Decreased safety awareness      OT Treatment/Interventions: Self-care/ADL training;Therapeutic exercise;Therapeutic activities;DME and/or AE instruction    OT Goals(Current goals can be found in the care plan section) Acute Rehab OT Goals Patient Stated Goal: to go home. OT Goal Formulation: With patient Time For Goal Achievement: 01/27/23 Potential to Achieve Goals: Good  OT Frequency: Min 1X/week    Co-evaluation              AM-PAC OT "6 Clicks" Daily Activity     Outcome Measure Help from another person eating meals?: None Help from another person taking care of  personal grooming?: A Little Help from another person toileting, which includes using toliet, bedpan, or urinal?: A Little Help from another person bathing (including washing, rinsing, drying)?: A Little Help from another person to put on and taking off regular upper body clothing?: None Help from another person to put on and taking off regular lower body clothing?: A Little 6 Click Score: 20   End of Session Equipment Utilized During Treatment: Rolling walker (2 wheels)  Activity Tolerance: Other (comment) (Pt limited by feeling very anxious and nervous) Patient left: in bed;with call bell/phone within reach;with bed alarm set  OT Visit Diagnosis: Unsteadiness on feet (R26.81);Other abnormalities of gait and mobility (R26.89);Repeated falls (R29.6);History of falling (Z91.81);Muscle weakness (generalized) (M62.81);Dizziness and giddiness (R42)                Time:  -    Charges:     Butch Penny, SOT

## 2023-01-13 NOTE — [place] FL2 (Signed)
[place] MEDICAID FL2 LEVEL OF CARE FORM     IDENTIFICATION  Patient Name: Brooke Gardner Birthdate: [birth year] Sex: female Admission Date (Current Location): 01/12/2023  [facility] and [place] Number:  Chiropodist and Address:  [facility], [address] [place], [place] [address]      Provider Number: [id number]  Attending Physician Name and Address:  Marcelino Duster, MD  Relative Name and Phone Number:  Lacy Duverney  [phone]    Current Level of Care: Hospital Recommended Level of Care: Skilled Nursing Facility Prior Approval Number:    Date Approved/Denied:   PASRR Number: [id number]  Discharge Plan: SNF    Current Diagnoses: Patient Active Problem List   Diagnosis Date Noted   Fall at home, initial encounter 01/12/2023   Obesity (BMI 30-39.9) 01/12/2023   Hyponatremia 01/12/2023   Hypomagnesemia 01/12/2023   Lung mass 01/12/2023   HTN (hypertension)    HLD (hyperlipidemia)    Diabetes mellitus without complication (HCC)     Orientation RESPIRATION BLADDER Height & Weight     Self, Time, Situation, Place  Normal Continent Weight: 77.4 kg Height:  5\' 1"  (154.9 cm)  BEHAVIORAL SYMPTOMS/MOOD NEUROLOGICAL BOWEL NUTRITION STATUS      Continent  (See Discharge Summary)  AMBULATORY STATUS COMMUNICATION OF NEEDS Skin   Extensive Assist Verbally Normal                       Personal Care Assistance Level of Assistance  Bathing, Feeding, Dressing Bathing Assistance: Limited assistance Feeding assistance: Limited assistance Dressing Assistance: Limited assistance     Functional Limitations Info  Sight, Speech Sight Info: Adequate Hearing Info: Adequate Speech Info: Adequate    SPECIAL CARE FACTORS FREQUENCY  PT (By licensed PT), OT (By licensed OT)     PT Frequency: 5x weekly OT Frequency: 5x weekly            Contractures Contractures Info: Not present    Additional Factors Info  Code Status,  Allergies Code Status Info: Full Code Allergies Info: Percocet (Oxycodone-acetaminophen)           Current Medications (01/13/2023):  This is the current hospital active medication list Current Facility-Administered Medications  Medication Dose Route Frequency Provider Last Rate Last Admin   0.9 %  sodium chloride infusion   Intravenous Continuous Lorretta Harp, MD 75 mL/hr at 01/12/23 2255 New Bag at 01/12/23 2255   atorvastatin (LIPITOR) tablet 20 mg  20 mg Oral Daily Lorretta Harp, MD   20 mg at 01/13/23 0819   heparin injection 5,000 Units  5,000 Units Subcutaneous Alean Rinne, MD   5,000 Units at 01/13/23 0429   hydrALAZINE (APRESOLINE) injection 5 mg  5 mg Intravenous Q2H PRN Lorretta Harp, MD       ibuprofen (ADVIL) tablet 400 mg  400 mg Oral Q6H PRN Lorretta Harp, MD       insulin aspart (novoLOG) injection 0-5 Units  0-5 Units Subcutaneous QHS Lorretta Harp, MD       insulin aspart (novoLOG) injection 0-9 Units  0-9 Units Subcutaneous TID WC Lorretta Harp, MD       ondansetron [facility]) injection 4 mg  4 mg Intravenous Q8H PRN Lorretta Harp, MD       potassium chloride (KLOR-CON) packet 40 mEq  40 mEq Oral BID Marcelino Duster, MD   40 mEq at 01/13/23 1242   sodium chloride tablet 1 g  1 g Oral BID WC Lorretta Harp, MD  1 g at 01/13/23 1610     Discharge Medications: Please see discharge summary for a list of discharge medications.  Relevant Imaging Results:  Relevant Lab Results:   Additional Information [id number]  Garret Reddish, RN

## 2023-01-13 NOTE — Progress Notes (Signed)
Progress Note   Patient: Brooke Gardner ZOX:[id number] DOB: 1955/11/03 DOA: 01/12/2023     0 DOS: the patient was seen and examined on 01/13/2023   Brief hospital course: Brooke Gardner is a 67 y.o. female with medical history significant of HTN, HLD, DM, obesity, who presents with fall.  Patient states that she had fell 3 times yesterday mostly due to loss of balance.  Denies any dizziness or lightheadedness, headaches or chest pain.  MRI of the brain unremarkable.  Labs showed sodium of 127, mag 1.3  Assessment and Plan: Fall at home, initial encounter:  MRI of the brain negative for stroke.  CT of head and neck negative for LVO.  Potential differential diagnosis is dehydration and positive orthostatic status. Continue to hold hydrochlorothiazide. Stop IV hydration. Encourage oral fluids. PT OT follow-up. TOC working on placement.  Hyponatremia: Sodium 127.   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 Trend sodium. Stopped IV fluids. Sodium chloride tablet 1 g twice daily f/u by BMP q8h   HTN (hypertension) IV hydralazine as needed Continue to hold hydrochlorothiazide due to low sodium.   HLD (hyperlipidemia) Continue Lipitor    Diabetes mellitus without complication [facility]):  Recent A1c 6.7, well-controlled.  Hold oral antihyperglycemic's Current Accu-Cheks, sliding scale insulin.  Hypomagnesemia:  Recheck magnesium. Monitor daily renal function   Lung mass: CTA of head/neck showed a solid mass in the right upper lobe measuring up to 2.0 cm, which appears more solid than seen in the imaged lung on the prior CT cervical spine. This is concerning for a primary lung malignancy. Will get CT chest for lung mass evaluation. Need outpatient pulmonary and oncology follow up.   Obesity (BMI 30-39.9):  Body weight 77.4 kg, BMI 32.24 Diet, exercise, weight reduction advised.    Out of bed to  chair. Incentive spirometry. Nursing supportive care. Fall, aspiration precautions. DVT prophylaxis   Code Status: Full Code  Subjective: Patient is seen and examined today morning. She is lying in bed, feels weak. Did not get out of bed. Eating poor.  Physical Exam: Vitals:   01/12/23 2139 01/12/23 2158 01/13/23 0030 01/13/23 0746  BP: (!) 136/92  132/64 (!) 136/59  Pulse: 63  67 (!) 56  Resp: 19  16 15   Temp: 98.3 F (36.8 C)  98.2 F (36.8 C) 97.7 F (36.5 C)  TempSrc: Oral  Oral   SpO2: 99%  97% 93%  Weight:  77.4 kg    Height:  5\' 1"  (1.549 m)      General - Elderly obese Caucasian female, no apparent distress HEENT - PERRLA, EOMI, atraumatic head, non tender sinuses. Lung - Clear, basal rales, rhonchi, no wheezes. Heart - S1, S2 heard, no murmurs, rubs, trace pedal edema. Abdomen - Soft, non tender non distended, bowel sounds good Neuro - Alert, awake and oriented x 3, non focal exam. Skin - Warm and dry.  Data Reviewed:      Latest Ref Rng & Units 01/13/2023    3:09 AM 01/12/2023    3:51 PM 07/30/2018   12:39 PM  CBC  WBC 4.0 - 10.5 K/uL 9.6  8.8  7.7   Hemoglobin 12.0 - 15.0 g/dL 81.1  91.4  78.2   Hematocrit 36.0 - 46.0 % 30.3  36.1  36.6   Platelets 150 - 400 K/uL 265  314  263       Latest Ref Rng & Units 01/13/2023  11:24 AM 01/13/2023    3:09 AM 01/12/2023    8:00 PM  BMP  Glucose 70 - 99 mg/dL 086  90  97   BUN 8 - 23 mg/dL 10  11  10    Creatinine 0.44 - 1.00 mg/dL 5.78  4.69  6.29   Sodium 135 - 145 mmol/L 127  129  127   Potassium 3.5 - 5.1 mmol/L 3.2  3.0  3.6   Chloride 98 - 111 mmol/L 93  93  92   CO2 22 - 32 mmol/L 25  27  23    Calcium 8.9 - 10.3 mg/dL 8.6  8.7  8.8    MR BRAIN WO CONTRAST  Result Date: 01/12/2023 CLINICAL DATA:  Multiple falls, difficulty with balance EXAM: MRI HEAD WITHOUT CONTRAST TECHNIQUE: Multiplanar, multiecho pulse sequences of the brain and surrounding structures were obtained without intravenous contrast.  COMPARISON:  No prior MRI available, correlation is made with CTA head and neck 01/12/2023 FINDINGS: Brain: No restricted diffusion to suggest acute or subacute infarct. No acute hemorrhage, mass, mass effect, or midline shift. No hydrocephalus or extra-axial collection. Normal pituitary and craniocervical junction. No hemosiderin deposition to suggest remote hemorrhage. Scattered T2 hyperintense signal in the periventricular white matter, likely the sequela of mild chronic small vessel ischemic disease. Vascular: Normal arterial flow voids. Skull and upper cervical spine: Normal marrow signal. Sinuses/Orbits: Minimal mucosal thickening in the ethmoid air cells. No acute finding in the orbits. Other: The mastoid air cells are well aerated. IMPRESSION: No acute intracranial process. No evidence of acute or subacute infarct. Electronically Signed   By: Wiliam Ke M.D.   On: 01/12/2023 22:14   CT Angio Head Neck W WO CM  Result Date: 01/12/2023 CLINICAL DATA:  Multiple falls and head strikes, difficulty with balance EXAM: CT ANGIOGRAPHY HEAD AND NECK WITH AND WITHOUT CONTRAST TECHNIQUE: Multidetector CT imaging of the head and neck was performed using the standard protocol during bolus administration of intravenous contrast. Multiplanar CT image reconstructions and MIPs were obtained to evaluate the vascular anatomy. Carotid stenosis measurements (when applicable) are obtained utilizing NASCET criteria, using the distal internal carotid diameter as the denominator. RADIATION DOSE REDUCTION: This exam was performed according to the departmental dose-optimization program which includes automated exposure control, adjustment of the mA and/or kV according to patient size and/or use of iterative reconstruction technique. CONTRAST:  75mL OMNIPAQUE IOHEXOL 350 MG/ML SOLN COMPARISON:  No prior CTA available, correlation is made with 07/30/2018 CT head and cervical spine FINDINGS: CT HEAD FINDINGS Brain: No evidence of  acute infarct, hemorrhage, mass, mass effect, or midline shift. No hydrocephalus or extra-axial fluid collection. Vascular: No hyperdense vessel. Skull: Negative for fracture or focal lesion. Sinuses/Orbits: No acute finding. Other: The mastoid air cells are well aerated. CTA NECK FINDINGS Aortic arch: Standard branching. Imaged portion shows no evidence of aneurysm or dissection. No significant stenosis of the major arch vessel origins. Right carotid system: No evidence of dissection, occlusion, or hemodynamically significant stenosis (greater than 50%). Left carotid system: No evidence of dissection, occlusion, or hemodynamically significant stenosis (greater than 50%). Vertebral arteries: No evidence of dissection, occlusion, or hemodynamically significant stenosis (greater than 50%). Skeleton: No acute osseous abnormality. Degenerative changes in the cervical spine. Other neck: No acute finding. Upper chest: Solid mass in the right upper lobe, which measures up to 2.0 x 1.1 cm in the axial plane (series 10, image 277), which appears more solid than seen in the imaged lung on the prior  CT cervical spine. Review of the MIP images confirms the above findings CTA HEAD FINDINGS Anterior circulation: Both internal carotid arteries are patent to the termini, without significant stenosis. Posteriorly directed protrusion from the right supraclinoid ICA (series 10, image 114), is favored to be an infundibulum at the origin of a diminutive right posterior communicating artery. A1 segments patent. Normal anterior communicating artery. Anterior cerebral arteries are patent to their distal aspects without significant stenosis. No M1 stenosis or occlusion. MCA branches perfused to their distal aspects without significant stenosis. Posterior circulation: Vertebral arteries patent to the vertebrobasilar junction without significant stenosis. Posterior inferior cerebellar arteries patent proximally. Basilar patent to its distal  aspect without significant stenosis. Superior cerebellar arteries patent proximally. Patent P1 segments. PCAs perfused to their distal aspects without significant stenosis. The bilateral posterior communicating arteries are diminutive but patent. Venous sinuses: As permitted by contrast timing, patent. Anatomic variants: None significant. No evidence of aneurysm or vascular malformation. Review of the MIP images confirms the above findings IMPRESSION: 1. No acute intracranial process. 2. No intracranial large vessel occlusion or significant stenosis. 3. No hemodynamically significant stenosis in the neck. 4. Solid mass in the right upper lobe measuring up to 2.0 cm, which appears more solid than seen in the imaged lung on the prior CT cervical spine. This is concerning for a primary lung malignancy. Electronically Signed   By: Wiliam Ke M.D.   On: 01/12/2023 22:12   DG Chest 2 View  Result Date: 01/12/2023 CLINICAL DATA:  Shortness of breath. EXAM: CHEST - 2 VIEW COMPARISON:  None Available. FINDINGS: Bilateral lung fields are clear. Note is made of elevated right hemidiaphragm. Bilateral costophrenic angles are clear. Normal cardio-mediastinal silhouette. No acute osseous abnormalities. The soft tissues are within normal limits. IMPRESSION: No active cardiopulmonary disease. Electronically Signed   By: Jules Schick M.D.   On: 01/12/2023 17:13     Family Communication: Discussed with patient, she understands and agrees. All questions answereed.    Disposition: Status is: Observation The patient will require care spanning > 2 midnights and should be moved to inpatient because: hyponatremia, orthostatic  Planned Discharge Destination: Skilled nursing facility     Time spent: 40 minutes  Author: Marcelino Duster, MD 01/13/2023 11:51 AM Secure chat 7am to 7pm For on call review [email or web address].

## 2023-01-14 DIAGNOSIS — E785 Hyperlipidemia, unspecified: Secondary | ICD-10-CM | POA: Diagnosis not present

## 2023-01-14 DIAGNOSIS — Y92009 Unspecified place in unspecified non-institutional (private) residence as the place of occurrence of the external cause: Secondary | ICD-10-CM | POA: Diagnosis not present

## 2023-01-14 DIAGNOSIS — W19XXXA Unspecified fall, initial encounter: Secondary | ICD-10-CM | POA: Diagnosis not present

## 2023-01-14 DIAGNOSIS — E871 Hypo-osmolality and hyponatremia: Secondary | ICD-10-CM | POA: Diagnosis not present

## 2023-01-14 LAB — BASIC METABOLIC PANEL
Anion gap: 8 (ref 5–15)
BUN: 7 mg/dL — ABNORMAL LOW (ref 8–23)
CO2: 25 mmol/L (ref 22–32)
Calcium: 8.6 mg/dL — ABNORMAL LOW (ref 8.9–10.3)
Chloride: 97 mmol/L — ABNORMAL LOW (ref 98–111)
Creatinine, Ser: 0.72 mg/dL (ref 0.44–1.00)
GFR, Estimated: >60 mL/min (ref >60)
Glucose, Bld: 109 mg/dL — ABNORMAL HIGH (ref 70–99)
Potassium: 3.8 mmol/L (ref 3.5–5.1)
Sodium: 130 mmol/L — ABNORMAL LOW (ref 135–145)

## 2023-01-14 LAB — CBC
HCT: 30.7 % — ABNORMAL LOW (ref 36.0–46.0)
Hemoglobin: 10.8 g/dL — ABNORMAL LOW (ref 12.0–15.0)
MCH: 29.7 pg (ref 26.0–34.0)
MCHC: 35.2 g/dL (ref 30.0–36.0)
MCV: 84.3 fL (ref 80.0–100.0)
Platelets: 285 10*3/uL (ref 150–400)
RBC: 3.64 MIL/uL — ABNORMAL LOW (ref 3.87–5.11)
RDW: 12.5 % (ref 11.5–15.5)
WBC: 7.9 10*3/uL (ref 4.0–10.5)
nRBC: 0 % (ref 0.0–0.2)

## 2023-01-14 LAB — GLUCOSE, CAPILLARY
Glucose-Capillary: 116 mg/dL — ABNORMAL HIGH (ref 70–99)
Glucose-Capillary: 123 mg/dL — ABNORMAL HIGH (ref 70–99)
Glucose-Capillary: 151 mg/dL — ABNORMAL HIGH (ref 70–99)
Glucose-Capillary: 85 mg/dL (ref 70–99)

## 2023-01-14 MED ORDER — LOSARTAN POTASSIUM 50 MG PO TABS
50.0000 mg | ORAL_TABLET | Freq: Every day | ORAL | Status: DC
  Administered 2023-01-15 – 2023-01-17 (×3): 50 mg via ORAL
  Filled 2023-01-14 (×3): qty 1

## 2023-01-14 NOTE — Progress Notes (Signed)
Occupational Therapy Treatment Patient Details Name: Brooke Gardner MRN: [id number] DOB: 06-Apr-1955 Today's Date: 01/14/2023   History of present illness 67 y/o female presented to ED on 01/12/23 after multiple falls and hitting head x 3 in past couple. MRI brain negative. PMH: HTN, DM, HLD   OT comments  Chart reviewed, pt greeted in bed, requesting to use bathroom. Tx session targeted improving functional activity tolerance for ADL tasks. Improvements noted in functional amb on this date with pt amb to bathroom with RW with CGA-MIN A. Toilet transfer completed with MIN A, LB dressing with MIN A. Grooming completed at sink level with CGA. Pt is making progress towards goals, discharge recommendation remains appropriate. OT will continue to follow.       If plan is discharge home, recommend the following:  A little help with walking and/or transfers;A little help with bathing/dressing/bathroom;Assistance with cooking/housework;Assist for transportation;Help with stairs or ramp for entrance   Equipment Recommendations  BSC/3in1    Recommendations for Other Services      Precautions / Restrictions Precautions Precautions: Fall Restrictions Weight Bearing Restrictions: No       Mobility Bed Mobility Overal bed mobility: Needs Assistance Bed Mobility: Supine to Sit     Supine to sit: Supervision, Used rails, HOB elevated          Transfers Overall transfer level: Needs assistance Equipment used: Rolling walker (2 wheels) Transfers: Sit to/from Stand Sit to Stand: Contact guard assist, Min assist                 Balance Overall balance assessment: Needs assistance Sitting-balance support: Bilateral upper extremity supported, Feet supported Sitting balance-Leahy Scale: Good     Standing balance support: During functional activity, Reliant on assistive device for balance, Bilateral upper extremity supported Standing balance-Leahy Scale: Fair                              ADL either performed or assessed with clinical judgement   ADL Overall ADL's : Needs assistance/impaired     Grooming: Wash/dry hands;Standing;Contact guard assist               Lower Body Dressing: Minimal assistance Lower Body Dressing Details (indicate cue type and reason): underwear Toilet Transfer: Rolling walker (2 wheels);Regular Toilet;Ambulation;Minimal assistance   Toileting- Clothing Manipulation and Hygiene: Supervision/safety;Sitting/lateral lean       Functional mobility during ADLs: Contact guard assist;Minimal assistance;Rolling walker (2 wheels) (intermittent vcs for technique, approx 15' two attempts)      Extremity/Trunk Assessment              Vision       Perception     Praxis      Cognition Arousal: Alert Behavior During Therapy: WFL for tasks assessed/performed Overall Cognitive Status: Within Functional Limits for tasks assessed                                          Exercises Other Exercises Other Exercises: edu re: importance of progressing mobility, ADL participation    Shoulder Instructions       General Comments vss pre/post session    Pertinent Vitals/ Pain       Pain Assessment Pain Assessment: No/denies pain  Home Living  Prior Functioning/Environment              Frequency  Min 1X/week        Progress Toward Goals  OT Goals(current goals can now be found in the care plan section)  Progress towards OT goals: Progressing toward goals     Plan      Co-evaluation                 AM-PAC OT "6 Clicks" Daily Activity     Outcome Measure   Help from another person eating meals?: None Help from another person taking care of personal grooming?: None Help from another person toileting, which includes using toliet, bedpan, or urinal?: None Help from another person bathing (including washing, rinsing,  drying)?: A Little Help from another person to put on and taking off regular upper body clothing?: None Help from another person to put on and taking off regular lower body clothing?: A Little 6 Click Score: 22    End of Session Equipment Utilized During Treatment: Rolling walker (2 wheels)  OT Visit Diagnosis: Unsteadiness on feet (R26.81);Other abnormalities of gait and mobility (R26.89);Repeated falls (R29.6);History of falling (Z91.81);Muscle weakness (generalized) (M62.81);Dizziness and giddiness (R42)   Activity Tolerance Patient tolerated treatment well   Patient Left in chair;with call bell/phone within reach;with chair alarm set   Nurse Communication  (NT re mobility status)        Time: 1150-1201 OT Time Calculation (min): 11 min  Charges: OT General Charges $OT Visit: 1 Visit OT Treatments $Self Care/Home Management : 8-22 mins  Oleta Mouse, OTD OTR/L  01/14/23, 12:33 PM

## 2023-01-14 NOTE — Progress Notes (Signed)
Progress Note   Patient: Brooke Gardner WUJ:[id number] DOB: [birth year] DOA: 01/12/2023     1 DOS: the patient was seen and examined on 01/14/2023   Brief hospital course: Brooke Gardner is a 67 y.o. female with medical history significant of HTN, HLD, DM, obesity, who presents with fall.  Patient states that she had fell 3 times yesterday mostly due to loss of balance.  Denies any dizziness or lightheadedness, headaches or chest pain.  MRI of the brain unremarkable.  Labs showed sodium of 127, mag 1.3  Assessment and Plan: Fall at home, initial encounter:  MRI of the brain negative for stroke.  CT of head and neck negative for LVO.   Pt reported feeling weak. --PT/OT  --SNF rehab  Hyponatremia 2/2 SIADH Sodium 127 on presentation.  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 Stopped IV fluids. Started on Sodium chloride tablet 1 g twice daily --cont salt tablets --fluid restriction 1200 ml   HTN (hypertension) --resume home losartan   HLD (hyperlipidemia) Continue Lipitor    Diabetes mellitus without complication (HCC):  Recent A1c 6.7, well-controlled.  Hold oral antihyperglycemic's --d/c Accu-Cheks, sliding scale insulin.  No need.  Hypomagnesemia:  --monitor and supplement PRN   Lung mass: CTA of head/neck showed a solid mass in the right upper lobe measuring up to 2.0 cm, which appears more solid than seen in the imaged lung on the prior CT cervical spine. This is concerning for a primary lung malignancy. --CT confirmed similar --discussed with oncology Dr. [name], whose office will schedule pt for followup and further workup.   Obesity (BMI 30-39.9):  Body weight 77.4 kg, BMI 32.24 Diet, exercise, weight reduction advised.    Out of bed to chair. Incentive spirometry. Nursing supportive care. Fall, aspiration precautions. DVT prophylaxis   Code Status: Full Code  Subjective:  Only complaint is feeling weak and not knowing why.   Physical  Exam: Vitals:   01/14/23 0429 01/14/23 0740 01/14/23 1736 01/14/23 2024  BP: (!) 140/62 131/62 (!) 141/64 (!) 155/64  Pulse: 61 70 68 63  Resp: 18 20 16 16   Temp: 98 F (36.7 C) 98.3 F (36.8 C) 98.4 F (36.9 C) 98 F (36.7 C)  TempSrc: Oral     SpO2: 97% 97% 98% 100%  Weight:      Height:        Constitutional: NAD, AAOx3 HEENT: conjunctivae and lids normal, EOMI CV: No cyanosis.   RESP: normal respiratory effort, on RA Neuro: II - XII grossly intact.   Psych: Normal mood and affect.  Appropriate judgement and reason   Data Reviewed:      Latest Ref Rng & Units 01/14/2023    5:58 AM 01/13/2023    3:09 AM 01/12/2023    3:51 PM  CBC  WBC 4.0 - 10.5 K/uL 7.9  9.6  8.8   Hemoglobin 12.0 - 15.0 g/dL 95.6  21.3  08.6   Hematocrit 36.0 - 46.0 % 30.7  30.3  36.1   Platelets 150 - 400 K/uL 285  265  314       Latest Ref Rng & Units 01/14/2023    5:58 AM 01/13/2023    7:34 PM 01/13/2023   11:24 AM  BMP  Glucose 70 - 99 mg/dL 578  469  629   BUN 8 - 23 mg/dL 7  10  10    Creatinine 0.44 - 1.00 mg/dL 5.28  4.13  2.44   Sodium 135 - 145 mmol/L 130  127  127   Potassium 3.5 - 5.1 mmol/L 3.8  3.9  3.2   Chloride 98 - 111 mmol/L 97  93  93   CO2 22 - 32 mmol/L 25  24  25    Calcium 8.9 - 10.3 mg/dL 8.6  8.7  8.6    CT CHEST W CONTRAST  Result Date: 01/13/2023 CLINICAL DATA:  68 year old with frequent falls. CTA head and neck was performed yesterday and demonstrated a right upper lobe mass in the apex. Previous cervical spine CT in 2020 demonstrated a subsolid 2 cm nodule in this location. EXAM: CT CHEST WITH CONTRAST TECHNIQUE: Multidetector CT imaging of the chest was performed during intravenous contrast administration. RADIATION DOSE REDUCTION: This exam was performed according to the departmental dose-optimization program which includes automated exposure control, adjustment of the mA and/or kV according to patient size and/or use of iterative reconstruction technique.  CONTRAST:  75mL OMNIPAQUE IOHEXOL 300 MG/ML  SOLN COMPARISON:  AP and lateral chest yesterday, CTA head and neck yesterday, and CT scan cervical spine no contrast 07/30/2018. No prior chest dedicated CT. FINDINGS: Cardiovascular: The heart is upper limits of normal for size. There are no visible coronary calcifications, no pericardial effusion. There is mild aortic tortuosity with mild aortic atherosclerosis. The great vessels are widely patent and branch normally. There is no aortic aneurysm, stenosis or dissection. Pulmonary veins are normal caliber. Pulmonary arteries are normal caliber and centrally clear. Mediastinum/Nodes: There is a borderline prominent right mid hilar lymph node, 9 mm in short axis. No further intrathoracic adenopathy. No enlarged nodes. Unremarkable thyroid gland, thoracic esophagus, axillary spaces, thoracic trachea, and main bronchi. Lungs/Pleura: In the posteromedial right lung apex, there is a 2 x 1.4 cm irregular solid lesion, partially based against the posteromedial pleural surface. This is no larger than in 2020 but it does appear more solid and therefore is worrisome for neoplasm. Extending up to this, there is a tubular fluid-filled serpiginous structure measuring 1 cm in diameter consistent with an ectatic impacted peripheral bronchus, extending over several slices inferior to the mass but not extending as far as the hilum. There are trace pleural effusions. No pleural thickening is seen. There is no pneumothorax. There is a 3 mm subpleural noncalcified right middle lobe nodule laterally on 4:62. There are few linear scar-like opacities in the bases, mild central bronchial thickening consistent with bronchitis or reactive airway disease, and elevated right hemidiaphragm. The lungs are otherwise clear. No fibrotic changes or active infiltrates. Upper Abdomen: No acute findings. The liver is moderately steatotic without mass enhancement. There is cholelithiasis without findings of  acute cholecystitis or biliary dilatation. There is a hypodense 2.3 cm left adrenal nodule, Hounsfield density is 45. Musculoskeletal: Mild osteopenia and degenerative changes thoracic spine. No focal pathologic bone lesion is seen. No mass in the visualized chest wall. IMPRESSION: 1. 2 x 1.4 cm partially pleural-based irregular solid lesion in the posteromedial right lung apex. This is no larger than in 2020 but it does appear more solid and therefore is worrisome for neoplasm, specifically adenocarcinoma. PET-CT is recommended. 2. Tubular fluid-filled structure extending up to the mass consistent with an ectatic impacted peripheral bronchus. 3. Borderline prominent right mid hilar lymph node. No other adenopathy. 4. 3 mm noncalcified right middle lobe nodule. 5. Bronchitis or reactive airway disease. 6. 2.3 cm hypodense left adrenal nodule. Probable adenoma. Adrenal washout CT or chemical shift MRI recommended, or attention on PET-CT. 7. Cholelithiasis. 8. Moderate hepatic steatosis. 9. Aortic tortuosity and  mild atherosclerosis. Electronically Signed   By: Almira Bar M.D.   On: 01/13/2023 21:18     Family Communication: Discussed with patient, she understands and agrees. All questions answereed.    Disposition: Status is: inpatient  Planned Discharge Destination: Skilled nursing facility     Time spent: 50 minutes  Author: Darlin Priestly, MD 01/14/2023 11:48 PM Secure chat 7am to 7pm For on call review [email or web address].

## 2023-01-14 NOTE — Plan of Care (Signed)
[facility] Tonsillectomy/Adenoidectomy, Postoperative PEDS care plan entered in error.

## 2023-01-14 NOTE — TOC Progression Note (Signed)
Transition of Care [facility]) - Progression Note    Patient Details  Name: Brooke Gardner MRN: [id number] Date of Birth: May 18, 1955  Transition of Care [facility]) CM/SW Contact  Garret Reddish, RN Phone Number: 01/14/2023, 11:17 AM  Clinical Narrative:     Bed offers reviewed with patient .  She has chosen [employer] and [employer].  I have spoken with [name], Admission Coordinator for [facility].  He informs me that he will have  bed for patient on Monday.    I will leave update for weekend Case manager to start authorization on Sunday.    TOC will continue to follow for discharge planning.    Expected Discharge Plan: Skilled Nursing Facility Barriers to Discharge: No Barriers Identified  Expected Discharge Plan and Services     Post Acute Care Choice: Skilled Nursing Facility Living arrangements for the past 2 months: Single Family Home                                       Social Determinants of Health (SDOH) Interventions SDOH Screenings   Food Insecurity: No Food Insecurity (01/12/2023)  Housing: Low Risk  (01/12/2023)  Transportation Needs: No Transportation Needs (01/12/2023)  Utilities: Not At Risk (01/12/2023)  Financial Resource Strain: Medium Risk (09/16/2022)   Received from [facility]  Tobacco Use: Low Risk  (01/12/2023)    Readmission Risk Interventions     No data to display

## 2023-01-15 DIAGNOSIS — W19XXXA Unspecified fall, initial encounter: Secondary | ICD-10-CM | POA: Diagnosis not present

## 2023-01-15 DIAGNOSIS — E871 Hypo-osmolality and hyponatremia: Secondary | ICD-10-CM | POA: Diagnosis not present

## 2023-01-15 DIAGNOSIS — Y92009 Unspecified place in unspecified non-institutional (private) residence as the place of occurrence of the external cause: Secondary | ICD-10-CM | POA: Diagnosis not present

## 2023-01-15 MED ORDER — AMLODIPINE BESYLATE 5 MG PO TABS
2.5000 mg | ORAL_TABLET | Freq: Every day | ORAL | Status: DC
  Administered 2023-01-15 – 2023-01-17 (×3): 2.5 mg via ORAL
  Filled 2023-01-15 (×3): qty 1

## 2023-01-15 MED ORDER — ATORVASTATIN CALCIUM 20 MG PO TABS
20.0000 mg | ORAL_TABLET | Freq: Every day | ORAL | Status: DC
  Administered 2023-01-15 – 2023-01-16 (×2): 20 mg via ORAL
  Filled 2023-01-15 (×2): qty 1

## 2023-01-15 MED ORDER — ASPIRIN 81 MG PO CHEW
81.0000 mg | CHEWABLE_TABLET | Freq: Every day | ORAL | Status: DC
  Administered 2023-01-15 – 2023-01-17 (×3): 81 mg via ORAL
  Filled 2023-01-15 (×3): qty 1

## 2023-01-15 NOTE — Progress Notes (Signed)
PROGRESS NOTE  Brooke Gardner  DOB: October 19, 1955  PCP: [name], [name]:[id number]  DOA: 01/12/2023  LOS: 2 days  Hospital Day: 4  Brief narrative: Brooke Gardner is a 67 y.o. female with PMH significant for obesity, DM2, HTN, HLD 10/16, patient presented to the ED with complaint of multiple falls hitting head.  Reported probably worsening loss of balance in the past few weeks.  Denied any dizziness or lightheadedness On initial workup, MRI brain was unremarkable, urinalysis unremarkable for infection Labs showed sodium levels low at 127, magnesium level low at 1.3 Admitted to [facility]  On further workup CT chest with contrast showed  1. 2 x 1.4 cm partially pleural-based irregular solid lesion in the posteromedial right lung apex. This is no larger than in 2020 but it does appear more solid and therefore is worrisome for neoplasm, specifically adenocarcinoma. PET-CT is recommended. 2. Tubular fluid-filled structure extending up to the mass consistent with an ectatic impacted peripheral bronchus. 3. Borderline prominent right mid hilar lymph node. No other adenopathy. 4. 3 mm noncalcified right middle lobe nodule. 5. Bronchitis or reactive airway disease.  6. 2.3 cm hypodense left adrenal nodule. Probable adenoma. Adrenal washout CT or chemical shift MRI recommended, or attention on PET-CT.  See below for details  Subjective: Patient was seen and examined this morning.  Pleasant elderly Caucasian female.  Not in distress. In the last 24 hours Afebrile, heart rate in 60s, blood pressure elevated to 140s and 150s, breathing on room air Last set of blood work from 10/18 with sodium low at 130, hemoglobin low at 10.8  Assessment and plan: Suspect lung malignancy CT chest 10/17 findings as above suspicious for primary lung cancer.  It also showed prominent right mid hide lymphadenopathy, and a 2.3 cm left adrenal nodule.   Previous hospitalist Dr. [name] discussed with oncology Dr. [name],  whose office will schedule pt for followup and further workup. Respiratory status stable.  Not on supplemental oxygen.  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  Calculated serum osmolality 262.  Inappropriately elevated urine osmolality to 564, suggestive of SIADH. Currently on fluid restriction of 1200 mL and salt tablet 1 g twice daily Sodium level gradually improving.  Continue to monitor Recent Labs  Lab 01/12/23 1551 01/12/23 2000 01/13/23 0309 01/13/23 1124 01/13/23 1934 01/14/23 0558  NA 127* 127* 129* 127* 127* 130*   Hypokalemia/hypomagnesemia Improved with replacement.  Recheck tomorrow. Recent Labs  Lab 01/12/23 1551 01/12/23 2000 01/13/23 0309 01/13/23 1124 01/13/23 1934 01/14/23 0558  K 3.9 3.6 3.0* 3.2* 3.9 3.8  MG 1.3*  --  1.5*  --   --   --   PHOS 3.5  --   --   --   --   --    Type 2 diabetes mellitus A1c was 6.7.  Pending repeat A1c PTA meds-metformin 500 mg twice daily.  Currently on hold Currently on SSI/Accu-Cheks No results found for: "HGBA1C" Recent Labs  Lab 01/13/23 2034 01/14/23 0745 01/14/23 1152 01/14/23 1734 01/14/23 2137  GLUCAP 116* 116* 123* 151* 85   Essential hypertension PTA meds- HCTZ 25 mg daily, losartan 50 mg daily  Continue losartan.  Blood pressure trending up.  Avoid HCTZ due to hyponatremia.  Add low-dose amlodipine  HLD  Atherosclerosis Continue Lipitor.  Resume aspirin   Obesity  Body mass index is 32.24 kg/m. Patient has been advised to make an attempt to improve diet and exercise patterns to aid in weight loss.  Cholelithiasis No  evidence of acute cholecystitis.  Moderate hepatic steatosis Noted on CT scan.  Likely due to obesity.   Fall at home Reports progressively worsening loss of balance in the past few weeks leading to multiple falls on the day of presentation  MRI of the brain negative for acute intracranial process.  CT of head and neck negative for LVO.   Pt reported feeling weak. PT/OT eval was  obtained.  SNF recommended.  Goals of care   Code Status: Full Code     DVT prophylaxis:  heparin injection 5,000 Units Start: 01/13/23 0200   Antimicrobials: None Fluid: None Consultants: None currently Family Communication: None at bedside  Status: Inpatient Level of care:  Med-Surg   Patient is from: Home Needs to continue in-hospital care: Continue to monitor blood pressure, sodium level Anticipated d/c to: SNF.  Per Child psychotherapist note, rehab to have bed available on Monday      Diet:  Diet Order             Diet heart healthy/carb modified Fluid consistency: Thin; Fluid restriction: 1200 mL Fluid  Diet effective now                   Scheduled Meds:  amLODipine  2.5 mg Oral Daily   aspirin  81 mg Oral Daily   atorvastatin  20 mg Oral QHS   heparin injection (subcutaneous)  5,000 Units Subcutaneous Q8H   losartan  50 mg Oral Daily   potassium chloride  40 mEq Oral BID   sodium chloride  1 g Oral BID WC    PRN meds: hydrALAZINE, ibuprofen, ondansetron (ZOFRAN) IV   Infusions:    Antimicrobials: Anti-infectives (From admission, onward)    None       Objective: Vitals:   01/15/23 0500 01/15/23 0758  BP: (!) 149/62 (!) 149/62  Pulse: 60 61  Resp:  15  Temp: 98.1 F (36.7 C) (!) 97.5 F (36.4 C)  SpO2: 98% 97%   No intake or output data in the 24 hours ending 01/15/23 1042  Filed Weights   01/12/23 1503 01/12/23 2158  Weight: 77.1 kg 77.4 kg   Weight change:  Body mass index is 32.24 kg/m.   Physical Exam: General exam: Pleasant, elderly.  Not in distress Skin: No rashes, lesions or ulcers. HEENT: Atraumatic, normocephalic, no obvious bleeding Lungs: Clear to auscultation bilaterally CVS: Regular rate and rhythm, no murmur GI/Abd soft, nontender, distended from obesity, bowel sound present CNS: Alert, awake, oriented x 3 Psychiatry: Mood appropriate Extremities: No pedal edema, no calf tenderness  Data Review: I have  personally reviewed the laboratory data and studies available.  F/u labs ordered Unresulted Labs (From admission, onward)     Start     Ordered   01/16/23 0500  Hemoglobin A1c  Tomorrow morning,   R       Question:  Specimen collection method  Answer:  Lab=Lab collect   01/15/23 0832   01/16/23 0500  Basic metabolic panel  Tomorrow morning,   R       Question:  Specimen collection method  Answer:  Lab=Lab collect   01/15/23 0832   01/16/23 0500  CBC with Differential/Platelet  Tomorrow morning,   R       Question:  Specimen collection method  Answer:  Lab=Lab collect   01/15/23 0832   01/16/23 0500  Phosphorus  Tomorrow morning,   R       Question:  Specimen collection method  Answer:  Lab=Lab collect   01/15/23 7829   01/16/23 0500  Magnesium  Tomorrow morning,   R       Question:  Specimen collection method  Answer:  Lab=Lab collect   01/15/23 0832           Total time spent in review of labs and imaging, patient evaluation, formulation of plan, documentation and communication with family: 45 minutes  Signed, Lorin Glass, MD [facility] 01/15/2023

## 2023-01-16 DIAGNOSIS — E871 Hypo-osmolality and hyponatremia: Secondary | ICD-10-CM | POA: Diagnosis not present

## 2023-01-16 DIAGNOSIS — Y92009 Unspecified place in unspecified non-institutional (private) residence as the place of occurrence of the external cause: Secondary | ICD-10-CM | POA: Diagnosis not present

## 2023-01-16 DIAGNOSIS — W19XXXA Unspecified fall, initial encounter: Secondary | ICD-10-CM | POA: Diagnosis not present

## 2023-01-16 LAB — CBC WITH DIFFERENTIAL/PLATELET
Abs Immature Granulocytes: 0.02 10*3/uL (ref 0.00–0.07)
Basophils Absolute: 0.1 10*3/uL (ref 0.0–0.1)
Basophils Relative: 1 %
Eosinophils Absolute: 0.2 10*3/uL (ref 0.0–0.5)
Eosinophils Relative: 2 %
HCT: 32.2 % — ABNORMAL LOW (ref 36.0–46.0)
Hemoglobin: 11 g/dL — ABNORMAL LOW (ref 12.0–15.0)
Immature Granulocytes: 0 %
Lymphocytes Relative: 25 %
Lymphs Abs: 2.2 10*3/uL (ref 0.7–4.0)
MCH: 29.3 pg (ref 26.0–34.0)
MCHC: 34.2 g/dL (ref 30.0–36.0)
MCV: 85.9 fL (ref 80.0–100.0)
Monocytes Absolute: 0.8 10*3/uL (ref 0.1–1.0)
Monocytes Relative: 9 %
Neutro Abs: 5.5 10*3/uL (ref 1.7–7.7)
Neutrophils Relative %: 63 %
Platelets: 298 10*3/uL (ref 150–400)
RBC: 3.75 MIL/uL — ABNORMAL LOW (ref 3.87–5.11)
RDW: 12.7 % (ref 11.5–15.5)
WBC: 8.7 10*3/uL (ref 4.0–10.5)
nRBC: 0 % (ref 0.0–0.2)

## 2023-01-16 LAB — BASIC METABOLIC PANEL
Anion gap: 7 (ref 5–15)
BUN: 11 mg/dL (ref 8–23)
CO2: 25 mmol/L (ref 22–32)
Calcium: 8.8 mg/dL — ABNORMAL LOW (ref 8.9–10.3)
Chloride: 100 mmol/L (ref 98–111)
Creatinine, Ser: 0.75 mg/dL (ref 0.44–1.00)
GFR, Estimated: >60 mL/min (ref >60)
Glucose, Bld: 141 mg/dL — ABNORMAL HIGH (ref 70–99)
Potassium: 3.8 mmol/L (ref 3.5–5.1)
Sodium: 132 mmol/L — ABNORMAL LOW (ref 135–145)

## 2023-01-16 LAB — HEMOGLOBIN A1C
Hgb A1c MFr Bld: 6.1 % — ABNORMAL HIGH (ref 4.8–5.6)
Mean Plasma Glucose: 128.37 mg/dL

## 2023-01-16 LAB — MAGNESIUM: Magnesium: 1.9 mg/dL (ref 1.7–2.4)

## 2023-01-16 LAB — PHOSPHORUS: Phosphorus: 3.4 mg/dL (ref 2.5–4.6)

## 2023-01-16 NOTE — TOC Progression Note (Addendum)
Transition of Care [facility]) - Progression Note    Patient Details  Name: Brooke Gardner MRN: [id number] Date of Birth: 08/15/1955  Transition of [facility]) CM/SW Contact  Liliana Cline, LCSW Phone Number: 01/16/2023, 9:37 AM  Clinical Narrative:    [facility] started for [facility] expected admission 10/21.   12:20- Auth approved.   Expected Discharge Plan: Skilled Nursing Facility Barriers to Discharge: No Barriers Identified  Expected Discharge Plan and Services     Post Acute Care Choice: Skilled Nursing Facility Living arrangements for the past 2 months: Single Family Home                                       Social Determinants of Health (SDOH) Interventions SDOH Screenings   Food Insecurity: No Food Insecurity (01/12/2023)  Housing: Low Risk  (01/12/2023)  Transportation Needs: No Transportation Needs (01/12/2023)  Utilities: Not At Risk (01/12/2023)  Financial Resource Strain: Medium Risk (09/16/2022)   Received from [facility]  Tobacco Use: Low Risk  (01/12/2023)    Readmission Risk Interventions     No data to display

## 2023-01-16 NOTE — Progress Notes (Signed)
Physical Therapy Treatment Patient Details Name: Brooke Gardner MRN: [id number] DOB: 02-12-1956 Today's Date: 01/16/2023   History of Present Illness 67 y/o female presented to ED on 01/12/23 after multiple falls and hitting head x 3 in past couple. MRI brain negative. PMH: HTN, DM, HLD    PT Comments  Pt needing to use bathroom.  Bed mobility with head raised but no assist.  She is able to walk to bathroom then complete x 1 lap on unit with RW and cga x 1.  She did have one significant LOB that could have resulted in fall without intervention.  She is here due to a fall and stated she is increasingly fearful of falling at home and has not showered x 1 month due to fear and no handrails.  Landlord is resistant to installing them.  While mobility is increasing, she remains at an increased fall risk with no help at home.  She would benefit from SNF for strength and balance to increase safety upon discharge.  Asked pt about possibly looking at a new home/living arrangement but finances seem to be a barrier.   If plan is discharge home, recommend the following: Assistance with cooking/housework;Assist for transportation;Help with stairs or ramp for entrance;A little help with walking and/or transfers;A little help with bathing/dressing/bathroom   Can travel by private vehicle        Equipment Recommendations  Rolling walker (2 wheels);BSC/3in1    Recommendations for Other Services       Precautions / Restrictions Precautions Precautions: Fall Restrictions Weight Bearing Restrictions: No     Mobility  Bed Mobility   Bed Mobility: Supine to Sit, Sit to Supine     Supine to sit: Supervision, Used rails Sit to supine: Supervision, Used rails     Patient Response: Cooperative  Transfers Overall transfer level: Needs assistance Equipment used: Rolling walker (2 wheels) Transfers: Sit to/from Stand Sit to Stand: Contact guard assist                 Ambulation/Gait Ambulation/Gait assistance: Contact guard assist, Min assist Gait Distance (Feet): 200 Feet Assistive device: Rolling walker (2 wheels) Gait Pattern/deviations: Step-to pattern, Decreased stride length, Trunk flexed Gait velocity: decreased     General Gait Details: able to complete lap with +1 assist.  one LOB that likely would have resulted in fall without assist to recover.   Stairs             Wheelchair Mobility     Tilt Bed Tilt Bed Patient Response: Cooperative  Modified Rankin (Stroke Patients Only)       Balance Overall balance assessment: Needs assistance Sitting-balance support: Bilateral upper extremity supported, Feet supported Sitting balance-Leahy Scale: Good     Standing balance support: During functional activity, Reliant on assistive device for balance, Bilateral upper extremity supported Standing balance-Leahy Scale: Fair Standing balance comment: once significant LOB with gait.                            Cognition Arousal: Alert Behavior During Therapy: WFL for tasks assessed/performed Overall Cognitive Status: Within Functional Limits for tasks assessed                                          Exercises Other Exercises Other Exercises: to bathroom to void    General Comments  Pertinent Vitals/Pain Pain Assessment Pain Assessment: No/denies pain    Home Living                          Prior Function            PT Goals (current goals can now be found in the care plan section) Progress towards PT goals: Progressing toward goals    Frequency    Min 1X/week      PT Plan      Co-evaluation              AM-PAC PT "6 Clicks" Mobility   Outcome Measure  Help needed turning from your back to your side while in a flat bed without using bedrails?: None Help needed moving from lying on your back to sitting on the side of a flat bed without using  bedrails?: A Little Help needed moving to and from a bed to a chair (including a wheelchair)?: A Little Help needed standing up from a chair using your arms (e.g., wheelchair or bedside chair)?: A Little Help needed to walk in hospital room?: A Little Help needed climbing 3-5 steps with a railing? : A Little 6 Click Score: 19    End of Session Equipment Utilized During Treatment: Gait belt Activity Tolerance: Patient tolerated treatment well Patient left: in bed;with call bell/phone within reach;with bed alarm set Nurse Communication: Mobility status PT Visit Diagnosis: Unsteadiness on feet (R26.81);Muscle weakness (generalized) (M62.81);History of falling (Z91.81)     Time: 1351-1401 PT Time Calculation (min) (ACUTE ONLY): 10 min  Charges:    $Gait Training: 8-22 mins PT General Charges $$ ACUTE PT VISIT: 1 Visit                   Danielle Dess, PTA 01/16/23, 2:19 PM

## 2023-01-16 NOTE — Progress Notes (Signed)
PROGRESS NOTE  Brooke Gardner  DOB: 02-05-1956  PCP: [name], [place] WUJ:[id number]  DOA: 01/12/2023  LOS: 3 days  Hospital Day: 5  Brief narrative: Brooke Gardner is a 67 y.o. female with PMH significant for obesity, DM2, HTN, HLD 10/16, patient presented to the ED with complaint of multiple falls hitting head.  Reported probably worsening loss of balance in the past few weeks.  Denied any dizziness or lightheadedness On initial workup, MRI brain was unremarkable, urinalysis unremarkable for infection Labs showed sodium levels low at 127, magnesium level low at 1.3 Admitted to [facility]  On further workup CT chest with contrast showed  1. 2 x 1.4 cm partially pleural-based irregular solid lesion in the posteromedial right lung apex. This is no larger than in 2020 but it does appear more solid and therefore is worrisome for neoplasm, specifically adenocarcinoma. PET-CT is recommended. 2. Tubular fluid-filled structure extending up to the mass consistent with an ectatic impacted peripheral bronchus. 3. Borderline prominent right mid hilar lymph node. No other adenopathy. 4. 3 mm noncalcified right middle lobe nodule. 5. Bronchitis or reactive airway disease.  6. 2.3 cm hypodense left adrenal nodule. Probable adenoma. Adrenal washout CT or chemical shift MRI recommended, or attention on PET-CT.  See below for details  Subjective: Patient was seen and examined this morning.  Lying on bed.  Not in distress. Sodium level this morning slightly better at 132 Waiting for SNF bed.  Assessment and plan: Suspect lung malignancy CT chest 10/17 findings as above suspicious for primary lung cancer.  It also showed prominent right mid hide lymphadenopathy, and a 2.3 cm left adrenal nodule.   Previous hospitalist Dr. [name] discussed with oncology Dr. [name], whose office will schedule pt for followup and further workup. Respiratory status stable.  Not on supplemental oxygen.  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  Calculated serum osmolality 262.  Inappropriately elevated urine osmolality to 564, suggestive of SIADH. Currently on fluid restriction of 1200 mL and salt tablet 1 g twice daily Sodium level gradually improving.  Improved from 130 yesterday to 132 today.  Continue to monitor Recent Labs  Lab 01/12/23 1551 01/12/23 2000 01/13/23 0309 01/13/23 1124 01/13/23 1934 01/14/23 0558 01/16/23 0545  NA 127* 127* 129* 127* 127* 130* 132*   Hypokalemia/hypomagnesemia Improved with replacement.  Normal this morning. Recent Labs  Lab 01/12/23 1551 01/12/23 2000 01/13/23 0309 01/13/23 1124 01/13/23 1934 01/14/23 0558 01/16/23 0545  K 3.9   < > 3.0* 3.2* 3.9 3.8 3.8  MG 1.3*  --  1.5*  --   --   --  1.9  PHOS 3.5  --   --   --   --   --  3.4   < > = values in this interval not displayed.   Type 2 diabetes mellitus A1c 6.1 01/16/2023 PTA meds-metformin 500 mg twice daily.  Currently on hold Currently on SSI/Accu-Cheks Recent Labs  Lab 01/13/23 2034 01/14/23 0745 01/14/23 1152 01/14/23 1734 01/14/23 2137  GLUCAP 116* 116* 123* 151* 85   Essential hypertension PTA meds- HCTZ 25 mg daily, losartan 50 mg daily  Hydrochlorothiazide stopped because of hyponatremia.  Amlodipine added.  Losartan continued. Blood pressure remains in normal range today.  HLD  Atherosclerosis Continue Lipitor.  Resume aspirin   Obesity  Body mass index is 32.24 kg/m. Patient has been advised to make an attempt to improve diet and exercise patterns to aid in weight loss.  Cholelithiasis No evidence of acute  cholecystitis.  Moderate hepatic steatosis Noted on CT scan.  Likely due to obesity.   Fall at home Reports progressively worsening loss of balance in the past few weeks leading to multiple falls on the day of presentation  MRI of the brain negative for acute intracranial process.  CT of head and neck negative for LVO.   Pt reported feeling weak. PT/OT eval was obtained.  SNF  recommended.  Goals of care   Code Status: Full Code     DVT prophylaxis:  heparin injection 5,000 Units Start: 01/13/23 0200   Antimicrobials: None Fluid: None Consultants: None currently Family Communication: None at bedside  Status: Inpatient Level of care:  Med-Surg   Patient is from: Home Needs to continue in-hospital care: Continue to monitor blood pressure, sodium level Anticipated d/c to: SNF.  Per Child psychotherapist note, rehab to have bed available on Monday      Diet:  Diet Order             Diet heart healthy/carb modified Fluid consistency: Thin; Fluid restriction: 1200 mL Fluid  Diet effective now                   Scheduled Meds:  amLODipine  2.5 mg Oral Daily   aspirin  81 mg Oral Daily   atorvastatin  20 mg Oral QHS   heparin injection (subcutaneous)  5,000 Units Subcutaneous Q8H   losartan  50 mg Oral Daily   potassium chloride  40 mEq Oral BID   sodium chloride  1 g Oral BID WC    PRN meds: hydrALAZINE, ibuprofen, ondansetron (ZOFRAN) IV   Infusions:    Antimicrobials: Anti-infectives (From admission, onward)    None       Objective: Vitals:   01/16/23 0411 01/16/23 0849  BP: (!) 116/56 (!) 124/55  Pulse: (!) 55 (!) 55  Resp: 18 19  Temp:  98.2 F (36.8 C)  SpO2: 98% 98%    Intake/Output Summary (Last 24 hours) at 01/16/2023 0929 Last data filed at 01/15/2023 1915 Gross per 24 hour  Intake 360 ml  Output --  Net 360 ml    Filed Weights   01/12/23 1503 01/12/23 2158  Weight: 77.1 kg 77.4 kg   Weight change:  Body mass index is 32.24 kg/m.   Physical Exam: General exam: Pleasant, elderly.  Not in distress Skin: No rashes, lesions or ulcers. HEENT: Atraumatic, normocephalic, no obvious bleeding Lungs: Clear to auscultation bilaterally CVS: Regular rate and rhythm, no murmur GI/Abd soft, nontender, distended from obesity, bowel sound present CNS: Alert, awake, oriented x 3 Psychiatry: Mood  appropriate Extremities: No pedal edema, no calf tenderness  Data Review: I have personally reviewed the laboratory data and studies available.  F/u labs ordered Unresulted Labs (From admission, onward)    None      Total time spent in review of labs and imaging, patient evaluation, formulation of plan, documentation and communication with family: 45 minutes  Signed, Lorin Glass, MD [facility] 01/16/2023

## 2023-01-17 DIAGNOSIS — Y92009 Unspecified place in unspecified non-institutional (private) residence as the place of occurrence of the external cause: Secondary | ICD-10-CM | POA: Diagnosis not present

## 2023-01-17 DIAGNOSIS — W19XXXA Unspecified fall, initial encounter: Secondary | ICD-10-CM | POA: Diagnosis not present

## 2023-01-17 DIAGNOSIS — E871 Hypo-osmolality and hyponatremia: Secondary | ICD-10-CM | POA: Diagnosis not present

## 2023-01-17 MED ORDER — SODIUM CHLORIDE 1 G PO TABS: 1.0000 g | ORAL_TABLET | Freq: Two times a day (BID) | ORAL | Status: AC

## 2023-01-17 MED ORDER — AMLODIPINE BESYLATE 2.5 MG PO TABS: 2.5000 mg | ORAL_TABLET | Freq: Every day | ORAL | Status: AC

## 2023-01-17 NOTE — Progress Notes (Signed)
Report called to Raynelle Highland at [facility].

## 2023-01-17 NOTE — Discharge Summary (Signed)
Physician Discharge Summary  Brooke Gardner ZOX:[id number] DOB: [birth year] DOA: 01/12/2023  PCP: [name], PA  Admit date: 01/12/2023 Discharge date: 01/17/2023  Admitted From: home Discharge disposition: SNF  Recommendations at discharge:  Salt tablet 1 g twice daily for 7 days Follow-up fluid restriction of 1200 mL/day Hydrochlorothiazide has been stopped because of low sodium level.  Okay to continue losartan.  Amlodipine has been added  Brief narrative: Brooke Gardner is a 67 y.o. female with PMH significant for obesity, DM2, HTN, HLD 10/16, patient presented to the ED with complaint of multiple falls hitting head.  Reported probably worsening loss of balance in the past few weeks.  Denied any dizziness or lightheadedness On initial workup, MRI brain was unremarkable, urinalysis unremarkable for infection Labs showed sodium levels low at 127, magnesium level low at 1.3 Admitted to [facility]  On further workup CT chest with contrast showed  1. 2 x 1.4 cm partially pleural-based irregular solid lesion in the posteromedial right lung apex. This is no larger than in 2020 but it does appear more solid and therefore is worrisome for neoplasm, specifically adenocarcinoma. PET-CT is recommended. 2. Tubular fluid-filled structure extending up to the mass consistent with an ectatic impacted peripheral bronchus. 3. Borderline prominent right mid hilar lymph node. No other adenopathy. 4. 3 mm noncalcified right middle lobe nodule. 5. Bronchitis or reactive airway disease.  6. 2.3 cm hypodense left adrenal nodule. Probable adenoma. Adrenal washout CT or chemical shift MRI recommended, or attention on PET-CT.  See below for details  Subjective: Patient was seen and examined this morning.  Lying on bed.  Not in distress. Sodium level this morning slightly better at 132 Waiting for SNF bed.  Assessment and plan: Suspect lung malignancy CT chest 10/17 findings as above suspicious for  primary lung cancer.  It also showed prominent right mid hide lymphadenopathy, and a 2.3 cm left adrenal nodule.   Previous hospitalist Dr. [name] discussed with oncology Dr. [name], whose office will schedule pt for followup and further workup. Respiratory status stable.  Not on supplemental oxygen.  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  Calculated serum osmolality 262.  Inappropriately elevated urine osmolality to 564, suggestive of SIADH. Currently on fluid restriction of 1200 mL and salt tablet 1 g twice daily Sodium level gradually improving.  Improved from 130 yesterday to 132 today.   I will continue salt tablet for 1 more week.  Continue 1200 mL fluid restriction post discharge. Recent Labs  Lab 01/12/23 1551 01/12/23 2000 01/13/23 0309 01/13/23 1124 01/13/23 1934 01/14/23 0558 01/16/23 0545  NA 127* 127* 129* 127* 127* 130* 132*   Hypokalemia/hypomagnesemia Improved with replacement.  Chronically on potassium replacement. Recent Labs  Lab 01/12/23 1551 01/12/23 2000 01/13/23 0309 01/13/23 1124 01/13/23 1934 01/14/23 0558 01/16/23 0545  K 3.9   < > 3.0* 3.2* 3.9 3.8 3.8  MG 1.3*  --  1.5*  --   --   --  1.9  PHOS 3.5  --   --   --   --   --  3.4   < > = values in this interval not displayed.   Type 2 diabetes mellitus A1c 6.1 01/16/2023 PTA meds-metformin 500 mg twice daily.  Currently on hold Currently on SSI/Accu-Cheks Recent Labs  Lab 01/13/23 2034 01/14/23 0745 01/14/23 1152 01/14/23 1734 01/14/23 2137  GLUCAP 116* 116* 123* 151* 85   Essential hypertension PTA meds- HCTZ 25 mg daily, losartan 50 mg daily  Hydrochlorothiazide  stopped because of hyponatremia.  Amlodipine added.  Losartan continued. Blood pressure remains in normal range today.  HLD  Atherosclerosis Continue Lipitor.  Resume aspirin   Obesity  Body mass index is 32.24 kg/m. Patient has been advised to make an attempt to improve diet and exercise patterns to aid in weight  loss.  Cholelithiasis No evidence of acute cholecystitis.  Moderate hepatic steatosis Noted on CT scan.  Likely due to obesity.   Fall at home Reports progressively worsening loss of balance in the past few weeks leading to multiple falls on the day of presentation  MRI of the brain negative for acute intracranial process.  CT of head and neck negative for LVO.   Pt reported feeling weak. PT/OT eval was obtained.  SNF recommended.  Goals of care   Code Status: Full Code   Wounds:  -    Discharge Exam:   Vitals:   01/16/23 1612 01/16/23 1949 01/17/23 0331 01/17/23 0758  BP: (!) 144/70 (!) 156/63 138/63 (!) 154/52  Pulse: (!) 55 61 64 (!) 57  Resp: 16 16 17 16   Temp: 98 F (36.7 C) 98.1 F (36.7 C) 97.7 F (36.5 C) 97.9 F (36.6 C)  TempSrc:  Oral Oral   SpO2: 99% 97% 93% 99%  Weight:      Height:        Body mass index is 32.24 kg/m.   General exam: Pleasant, elderly.  Not in distress.  Not in pain. Skin: No rashes, lesions or ulcers. HEENT: Atraumatic, normocephalic, no obvious bleeding Lungs: Clear to auscultation bilaterally.  No crackles or wheezing CVS: Regular rate and rhythm, no murmur GI/Abd soft, nontender, distended from obesity, bowel sound present CNS: Alert, awake, oriented x 3 Psychiatry: Mood appropriate Extremities: No pedal edema, no calf tenderness  Follow ups:    Follow-up Information     [name], PA Follow up.   Specialty: Physician Assistant Contact information: [address] [place] [address] [phone]         [name], PA Follow up.   Specialty: Physician Assistant Contact information: [address] [place] [address] [phone]                 Discharge Instructions:   Discharge Instructions     Call MD for:  difficulty breathing, headache or visual disturbances   Complete by: As directed    Call MD for:  extreme fatigue   Complete by: As directed    Call MD for:   hives   Complete by: As directed    Call MD for:  persistant dizziness or light-headedness   Complete by: As directed    Call MD for:  persistant nausea and vomiting   Complete by: As directed    Call MD for:  severe uncontrolled pain   Complete by: As directed    Call MD for:  temperature >100.4   Complete by: As directed    Diet - low sodium heart healthy   Complete by: As directed    Discharge instructions   Complete by: As directed    Recommendations at discharge:   Salt tablet 1 g twice daily for 7 days  Follow-up fluid restriction of 1200 mL/day  Hydrochlorothiazide has been stopped because of low sodium level.  Okay to continue losartan.  Amlodipine has been added.  General discharge instructions: Follow with Primary MD [name], PA in 7 days  Please request your PCP  to go  over your hospital tests, procedures, radiology results at the follow up. Please get your medicines reviewed and adjusted.  Your PCP may decide to repeat certain labs or tests as needed. Do not drive, operate heavy machinery, perform activities at heights, swimming or participation in water activities or provide baby sitting services if your were admitted for syncope or siezures until you have seen by Primary MD or a Neurologist and advised to do so again. [place] Controlled Substance Reporting System database was reviewed. Do not drive, operate heavy machinery, perform activities at heights, swim, participate in water activities or provide baby-sitting services while on medications for pain, sleep and mood until your outpatient physician has reevaluated you and advised to do so again.  You are strongly recommended to comply with the dose, frequency and duration of prescribed medications. Activity: As tolerated with Full fall precautions use walker/cane & assistance as needed Avoid using any recreational substances like cigarette, tobacco, alcohol, or non-prescribed drug. If you experience  worsening of your admission symptoms, develop shortness of breath, life threatening emergency, suicidal or homicidal thoughts you must seek medical attention immediately by calling 911 or calling your MD immediately  if symptoms less severe. You must read complete instructions/literature along with all the possible adverse reactions/side effects for all the medicines you take and that have been prescribed to you. Take any new medicine only after you have completely understood and accepted all the possible adverse reactions/side effects.  Wear Seat belts while driving. You were cared for by a hospitalist during your hospital stay. If you have any questions about your discharge medications or the care you received while you were in the hospital after you are discharged, you can call the unit and ask to speak with the hospitalist or the covering physician. Once you are discharged, your primary care physician will handle any further medical issues. Please note that NO REFILLS for any discharge medications will be authorized once you are discharged, as it is imperative that you return to your primary care physician (or establish a relationship with a primary care physician if you do not have one).   Increase activity slowly   Complete by: As directed        Discharge Medications:   Allergies as of 01/17/2023       Reactions   Hydrocodone-acetaminophen Itching   Tramadol Itching   Oxycodone-acetaminophen Itching, Other (See Comments)   Other Reaction: Not Assessed        Medication List     STOP taking these medications    calcium carbonate 750 MG chewable tablet Commonly known as: TUMS EX       TAKE these medications    amLODipine 2.5 MG tablet Commonly known as: NORVASC Take 1 tablet (2.5 mg total) by mouth daily. Start taking on: January 18, 2023   aspirin 81 MG chewable tablet Chew 81 mg by mouth daily.   atorvastatin 20 MG tablet Commonly known as: LIPITOR Take 1 tablet  by mouth at bedtime.   Calcium High Potency/Vitamin D 600-5 MG-MCG Tabs Generic drug: Calcium Carb-Cholecalciferol Take 1 tablet by mouth 2 (two) times daily with a meal.   I-Vite Tabs Take 1 tablet by mouth daily.   losartan 50 MG tablet Commonly known as: COZAAR Take 1 tablet by mouth daily.   metFORMIN 500 MG 24 hr tablet Commonly known as: GLUCOPHAGE-XR Take 2 tablets by mouth 2 (two) times daily.   potassium chloride SA 20 MEQ tablet Commonly known as: KLOR-CON M Take  20 mEq by mouth daily.   sodium chloride 1 g tablet Take 1 tablet (1 g total) by mouth 2 (two) times daily with a meal for 7 days.         The results of significant diagnostics from this hospitalization (including imaging, microbiology, ancillary and laboratory) are listed below for reference.    Procedures and Diagnostic Studies:   CT CHEST W CONTRAST  Result Date: 01/13/2023 CLINICAL DATA:  67 year old with frequent falls. CTA head and neck was performed yesterday and demonstrated a right upper lobe mass in the apex. Previous cervical spine CT in 2020 demonstrated a subsolid 2 cm nodule in this location. EXAM: CT CHEST WITH CONTRAST TECHNIQUE: Multidetector CT imaging of the chest was performed during intravenous contrast administration. RADIATION DOSE REDUCTION: This exam was performed according to the departmental dose-optimization program which includes automated exposure control, adjustment of the mA and/or kV according to patient size and/or use of iterative reconstruction technique. CONTRAST:  75mL OMNIPAQUE IOHEXOL 300 MG/ML  SOLN COMPARISON:  AP and lateral chest yesterday, CTA head and neck yesterday, and CT scan cervical spine no contrast 07/30/2018. No prior chest dedicated CT. FINDINGS: Cardiovascular: The heart is upper limits of normal for size. There are no visible coronary calcifications, no pericardial effusion. There is mild aortic tortuosity with mild aortic atherosclerosis. The great  vessels are widely patent and branch normally. There is no aortic aneurysm, stenosis or dissection. Pulmonary veins are normal caliber. Pulmonary arteries are normal caliber and centrally clear. Mediastinum/Nodes: There is a borderline prominent right mid hilar lymph node, 9 mm in short axis. No further intrathoracic adenopathy. No enlarged nodes. Unremarkable thyroid gland, thoracic esophagus, axillary spaces, thoracic trachea, and main bronchi. Lungs/Pleura: In the posteromedial right lung apex, there is a 2 x 1.4 cm irregular solid lesion, partially based against the posteromedial pleural surface. This is no larger than in 2020 but it does appear more solid and therefore is worrisome for neoplasm. Extending up to this, there is a tubular fluid-filled serpiginous structure measuring 1 cm in diameter consistent with an ectatic impacted peripheral bronchus, extending over several slices inferior to the mass but not extending as far as the hilum. There are trace pleural effusions. No pleural thickening is seen. There is no pneumothorax. There is a 3 mm subpleural noncalcified right middle lobe nodule laterally on 4:62. There are few linear scar-like opacities in the bases, mild central bronchial thickening consistent with bronchitis or reactive airway disease, and elevated right hemidiaphragm. The lungs are otherwise clear. No fibrotic changes or active infiltrates. Upper Abdomen: No acute findings. The liver is moderately steatotic without mass enhancement. There is cholelithiasis without findings of acute cholecystitis or biliary dilatation. There is a hypodense 2.3 cm left adrenal nodule, Hounsfield density is 45. Musculoskeletal: Mild osteopenia and degenerative changes thoracic spine. No focal pathologic bone lesion is seen. No mass in the visualized chest wall. IMPRESSION: 1. 2 x 1.4 cm partially pleural-based irregular solid lesion in the posteromedial right lung apex. This is no larger than in 2020 but it  does appear more solid and therefore is worrisome for neoplasm, specifically adenocarcinoma. PET-CT is recommended. 2. Tubular fluid-filled structure extending up to the mass consistent with an ectatic impacted peripheral bronchus. 3. Borderline prominent right mid hilar lymph node. No other adenopathy. 4. 3 mm noncalcified right middle lobe nodule. 5. Bronchitis or reactive airway disease. 6. 2.3 cm hypodense left adrenal nodule. Probable adenoma. Adrenal washout CT or chemical shift MRI recommended, or attention  on PET-CT. 7. Cholelithiasis. 8. Moderate hepatic steatosis. 9. Aortic tortuosity and mild atherosclerosis. Electronically Signed   By: Almira Bar M.D.   On: 01/13/2023 21:18   MR BRAIN WO CONTRAST  Result Date: 01/12/2023 CLINICAL DATA:  Multiple falls, difficulty with balance EXAM: MRI HEAD WITHOUT CONTRAST TECHNIQUE: Multiplanar, multiecho pulse sequences of the brain and surrounding structures were obtained without intravenous contrast. COMPARISON:  No prior MRI available, correlation is made with CTA head and neck 01/12/2023 FINDINGS: Brain: No restricted diffusion to suggest acute or subacute infarct. No acute hemorrhage, mass, mass effect, or midline shift. No hydrocephalus or extra-axial collection. Normal pituitary and craniocervical junction. No hemosiderin deposition to suggest remote hemorrhage. Scattered T2 hyperintense signal in the periventricular white matter, likely the sequela of mild chronic small vessel ischemic disease. Vascular: Normal arterial flow voids. Skull and upper cervical spine: Normal marrow signal. Sinuses/Orbits: Minimal mucosal thickening in the ethmoid air cells. No acute finding in the orbits. Other: The mastoid air cells are well aerated. IMPRESSION: No acute intracranial process. No evidence of acute or subacute infarct. Electronically Signed   By: Wiliam Ke M.D.   On: 01/12/2023 22:14   CT Angio Head Neck W WO CM  Result Date: 01/12/2023 CLINICAL  DATA:  Multiple falls and head strikes, difficulty with balance EXAM: CT ANGIOGRAPHY HEAD AND NECK WITH AND WITHOUT CONTRAST TECHNIQUE: Multidetector CT imaging of the head and neck was performed using the standard protocol during bolus administration of intravenous contrast. Multiplanar CT image reconstructions and MIPs were obtained to evaluate the vascular anatomy. Carotid stenosis measurements (when applicable) are obtained utilizing NASCET criteria, using the distal internal carotid diameter as the denominator. RADIATION DOSE REDUCTION: This exam was performed according to the departmental dose-optimization program which includes automated exposure control, adjustment of the mA and/or kV according to patient size and/or use of iterative reconstruction technique. CONTRAST:  75mL OMNIPAQUE IOHEXOL 350 MG/ML SOLN COMPARISON:  No prior CTA available, correlation is made with 07/30/2018 CT head and cervical spine FINDINGS: CT HEAD FINDINGS Brain: No evidence of acute infarct, hemorrhage, mass, mass effect, or midline shift. No hydrocephalus or extra-axial fluid collection. Vascular: No hyperdense vessel. Skull: Negative for fracture or focal lesion. Sinuses/Orbits: No acute finding. Other: The mastoid air cells are well aerated. CTA NECK FINDINGS Aortic arch: Standard branching. Imaged portion shows no evidence of aneurysm or dissection. No significant stenosis of the major arch vessel origins. Right carotid system: No evidence of dissection, occlusion, or hemodynamically significant stenosis (greater than 50%). Left carotid system: No evidence of dissection, occlusion, or hemodynamically significant stenosis (greater than 50%). Vertebral arteries: No evidence of dissection, occlusion, or hemodynamically significant stenosis (greater than 50%). Skeleton: No acute osseous abnormality. Degenerative changes in the cervical spine. Other neck: No acute finding. Upper chest: Solid mass in the right upper lobe, which  measures up to 2.0 x 1.1 cm in the axial plane (series 10, image 277), which appears more solid than seen in the imaged lung on the prior CT cervical spine. Review of the MIP images confirms the above findings CTA HEAD FINDINGS Anterior circulation: Both internal carotid arteries are patent to the termini, without significant stenosis. Posteriorly directed protrusion from the right supraclinoid ICA (series 10, image 114), is favored to be an infundibulum at the origin of a diminutive right posterior communicating artery. A1 segments patent. Normal anterior communicating artery. Anterior cerebral arteries are patent to their distal aspects without significant stenosis. No M1 stenosis or occlusion. MCA branches perfused  to their distal aspects without significant stenosis. Posterior circulation: Vertebral arteries patent to the vertebrobasilar junction without significant stenosis. Posterior inferior cerebellar arteries patent proximally. Basilar patent to its distal aspect without significant stenosis. Superior cerebellar arteries patent proximally. Patent P1 segments. PCAs perfused to their distal aspects without significant stenosis. The bilateral posterior communicating arteries are diminutive but patent. Venous sinuses: As permitted by contrast timing, patent. Anatomic variants: None significant. No evidence of aneurysm or vascular malformation. Review of the MIP images confirms the above findings IMPRESSION: 1. No acute intracranial process. 2. No intracranial large vessel occlusion or significant stenosis. 3. No hemodynamically significant stenosis in the neck. 4. Solid mass in the right upper lobe measuring up to 2.0 cm, which appears more solid than seen in the imaged lung on the prior CT cervical spine. This is concerning for a primary lung malignancy. Electronically Signed   By: Wiliam Ke M.D.   On: 01/12/2023 22:12   DG Chest 2 View  Result Date: 01/12/2023 CLINICAL DATA:  Shortness of breath.  EXAM: CHEST - 2 VIEW COMPARISON:  None Available. FINDINGS: Bilateral lung fields are clear. Note is made of elevated right hemidiaphragm. Bilateral costophrenic angles are clear. Normal cardio-mediastinal silhouette. No acute osseous abnormalities. The soft tissues are within normal limits. IMPRESSION: No active cardiopulmonary disease. Electronically Signed   By: Jules Schick M.D.   On: 01/12/2023 17:13     Labs:   Basic Metabolic Panel: Recent Labs  Lab 01/12/23 1551 01/12/23 2000 01/13/23 0309 01/13/23 1124 01/13/23 1934 01/14/23 0558 01/16/23 0545  NA 127*   < > 129* 127* 127* 130* 132*  K 3.9   < > 3.0* 3.2* 3.9 3.8 3.8  CL 91*   < > 93* 93* 93* 97* 100  CO2 24   < > 27 25 24 25 25   GLUCOSE 99   < > 90 133* 128* 109* 141*  BUN 11   < > 11 10 10  7* 11  CREATININE 0.63   < > 0.71 0.72 0.71 0.72 0.75  CALCIUM 9.5   < > 8.7* 8.6* 8.7* 8.6* 8.8*  MG 1.3*  --  1.5*  --   --   --  1.9  PHOS 3.5  --   --   --   --   --  3.4   < > = values in this interval not displayed.   GFR Estimated Creatinine Clearance: 62.4 mL/min (by C-G formula based on SCr of 0.75 mg/dL). Liver Function Tests: Recent Labs  Lab 01/12/23 1551  AST 23  ALT 13  ALKPHOS 60  BILITOT 1.2  PROT 7.7  ALBUMIN 4.4   No results for input(s): "LIPASE", "AMYLASE" in the last 168 hours. No results for input(s): "AMMONIA" in the last 168 hours. Coagulation profile No results for input(s): "INR", "PROTIME" in the last 168 hours.  CBC: Recent Labs  Lab 01/12/23 1551 01/13/23 0309 01/14/23 0558 01/16/23 0545  WBC 8.8 9.6 7.9 8.7  NEUTROABS  --   --   --  5.5  HGB 12.3 10.8* 10.8* 11.0*  HCT 36.1 30.3* 30.7* 32.2*  MCV 85.7 83.5 84.3 85.9  PLT 314 265 285 298   Cardiac Enzymes: No results for input(s): "CKTOTAL", "CKMB", "CKMBINDEX", "TROPONINI" in the last 168 hours. BNP: Invalid input(s): "POCBNP" CBG: Recent Labs  Lab 01/13/23 2034 01/14/23 0745 01/14/23 1152 01/14/23 1734 01/14/23 2137   GLUCAP 116* 116* 123* 151* 85   D-Dimer No results for input(s): "DDIMER" in the  last 72 hours. Hgb A1c Recent Labs    01/16/23 0545  HGBA1C 6.1*   Lipid Profile No results for input(s): "CHOL", "HDL", "LDLCALC", "TRIG", "CHOLHDL", "LDLDIRECT" in the last 72 hours. Thyroid function studies No results for input(s): "TSH", "T4TOTAL", "T3FREE", "THYROIDAB" in the last 72 hours.  Invalid input(s): "FREET3" Anemia work up No results for input(s): "VITAMINB12", "FOLATE", "FERRITIN", "TIBC", "IRON", "RETICCTPCT" in the last 72 hours. Microbiology No results found for this or any previous visit (from the past 240 hour(s)).  Time coordinating discharge: 45 minutes  Signed: Nimisha Rathel  [facility] 01/17/2023, 11:22 AM

## 2023-01-17 NOTE — TOC Transition Note (Addendum)
Transition of Care [facility]) - CM/SW Discharge Note   Patient Details  Name: Brooke Gardner MRN: [id number] Date of Birth: [birth year]  Transition of Care [facility]) CM/SW Contact:  Garret Reddish, RN Phone Number: 01/17/2023, 11:33 AM   Clinical Narrative:    Chart reviewed.  Noted that patient has orders for discharge today.    Noted that patient has received SNF approval. Plan auth ID- [id number] Auth ID- [id number] Authorization approved from 01-17-23- 01-19-23.  Next review date is 01/19/23.  I have informed [name] with [facility] that patient has been approved for SNF.  [name] informs me that he will have  a bed for patient today.  He reports that patient will to room E1 and the number to call report is [phone].  [name] informs me that patient will have a co-pay of 20 dollars per day.  I have informed [name] of this information.   I have sent [name], Admissions Coordinator with [facility] patient's Discharge summary, SNF transfer Packet and Discharge orders via the hub.     I have informed [name] that [facility] will transport her to the facility today.    I have arranged EMS transport with [facility] for transport to the facility today.    I have made staff nurse aware.       Final next level of care: Skilled Nursing Facility Barriers to Discharge: No Barriers Identified   Patient Goals and CMS Choice CMS Medicare.gov Compare Post Acute Care list provided to:: Patient Choice offered to / list presented to : Patient  Discharge Placement                Patient chooses bed at:  [facility]) Patient to be transferred to facility by: [facility] Name of family member notified: Patient reports that she will notify her niece. Patient and family notified of of transfer: 01/17/23  Discharge Plan and Services Additional resources added to the After Visit Summary for       Post Acute Care Choice: Skilled Nursing Facility                                Social Determinants of Health (SDOH) Interventions SDOH Screenings   Food Insecurity: No Food Insecurity (01/12/2023)  Housing: Low Risk  (01/12/2023)  Transportation Needs: No Transportation Needs (01/12/2023)  Utilities: Not At Risk (01/12/2023)  Financial Resource Strain: Medium Risk (09/16/2022)   Received from [facility]  Tobacco Use: Low Risk  (01/12/2023)     Readmission Risk Interventions     No data to display

## 2023-01-24 ENCOUNTER — Other Ambulatory Visit: Payer: Medicare HMO

## 2023-01-24 ENCOUNTER — Inpatient Hospital Stay: Payer: Medicare HMO | Admitting: Oncology
